# Patient Record
Sex: Female | Born: 1950 | Race: White | Hispanic: No | Marital: Single | Smoking: Current every day smoker
Health system: Southern US, Community
[De-identification: ages and names within clinical notes are randomized; demographics above are authoritative.]

## PROBLEM LIST (undated history)

## (undated) DIAGNOSIS — K2270 Barrett's esophagus without dysplasia: Secondary | ICD-10-CM

## (undated) DIAGNOSIS — M810 Age-related osteoporosis without current pathological fracture: Secondary | ICD-10-CM

## (undated) DIAGNOSIS — K859 Acute pancreatitis without necrosis or infection, unspecified: Secondary | ICD-10-CM

## (undated) DIAGNOSIS — F319 Bipolar disorder, unspecified: Secondary | ICD-10-CM

## (undated) DIAGNOSIS — G709 Myoneural disorder, unspecified: Secondary | ICD-10-CM

## (undated) DIAGNOSIS — I639 Cerebral infarction, unspecified: Secondary | ICD-10-CM

## (undated) DIAGNOSIS — K219 Gastro-esophageal reflux disease without esophagitis: Secondary | ICD-10-CM

## (undated) DIAGNOSIS — E119 Type 2 diabetes mellitus without complications: Secondary | ICD-10-CM

## (undated) DIAGNOSIS — R809 Proteinuria, unspecified: Secondary | ICD-10-CM

## (undated) DIAGNOSIS — M5136 Other intervertebral disc degeneration, lumbar region: Secondary | ICD-10-CM

## (undated) DIAGNOSIS — E7800 Pure hypercholesterolemia, unspecified: Secondary | ICD-10-CM

## (undated) DIAGNOSIS — M5030 Other cervical disc degeneration, unspecified cervical region: Secondary | ICD-10-CM

## (undated) DIAGNOSIS — M51369 Other intervertebral disc degeneration, lumbar region without mention of lumbar back pain or lower extremity pain: Secondary | ICD-10-CM

## (undated) HISTORY — PX: MOHS SURGERY: SHX181

## (undated) HISTORY — PX: TUBAL LIGATION: SHX77

## (undated) HISTORY — PX: BLADDER EXSTROPHY CLOSURE: SUR261

## (undated) HISTORY — PX: COLONOSCOPY: SHX5424

## (undated) HISTORY — PX: OTHER SURGICAL HISTORY: SHX169

---

## 2007-08-21 ENCOUNTER — Ambulatory Visit: Payer: Self-pay | Admitting: Internal Medicine

## 2009-01-12 ENCOUNTER — Ambulatory Visit: Payer: Self-pay | Admitting: Internal Medicine

## 2009-05-16 ENCOUNTER — Ambulatory Visit: Payer: Self-pay | Admitting: Internal Medicine

## 2010-02-11 ENCOUNTER — Ambulatory Visit: Payer: Self-pay | Admitting: Internal Medicine

## 2010-07-13 ENCOUNTER — Ambulatory Visit: Payer: Self-pay | Admitting: Internal Medicine

## 2010-11-07 ENCOUNTER — Emergency Department: Payer: Self-pay | Admitting: Emergency Medicine

## 2011-03-27 ENCOUNTER — Ambulatory Visit: Payer: Self-pay | Admitting: Family Medicine

## 2011-05-09 ENCOUNTER — Inpatient Hospital Stay: Payer: Self-pay | Admitting: Internal Medicine

## 2011-07-20 ENCOUNTER — Ambulatory Visit: Payer: Self-pay | Admitting: Gastroenterology

## 2011-08-05 ENCOUNTER — Ambulatory Visit: Payer: Self-pay | Admitting: Gastroenterology

## 2011-08-08 ENCOUNTER — Ambulatory Visit: Payer: Self-pay

## 2011-08-22 ENCOUNTER — Ambulatory Visit: Payer: Self-pay | Admitting: Gastroenterology

## 2011-08-22 DIAGNOSIS — Z860101 Personal history of adenomatous and serrated colon polyps: Secondary | ICD-10-CM | POA: Insufficient documentation

## 2011-08-22 DIAGNOSIS — Z86010 Personal history of colonic polyps: Secondary | ICD-10-CM | POA: Insufficient documentation

## 2011-08-22 DIAGNOSIS — K2270 Barrett's esophagus without dysplasia: Secondary | ICD-10-CM | POA: Insufficient documentation

## 2011-09-14 ENCOUNTER — Ambulatory Visit: Payer: Self-pay | Admitting: Physical Medicine and Rehabilitation

## 2011-10-03 ENCOUNTER — Ambulatory Visit: Payer: Self-pay | Admitting: Internal Medicine

## 2012-04-25 ENCOUNTER — Ambulatory Visit: Payer: Self-pay | Admitting: Family Medicine

## 2012-07-11 ENCOUNTER — Encounter: Payer: Self-pay | Admitting: Podiatry

## 2012-07-31 ENCOUNTER — Encounter: Payer: Self-pay | Admitting: Podiatry

## 2012-12-03 IMAGING — CR DG CHEST 1V PORT
1 series · 1 of 1 positions shown · non-contrast
Comparison: none

REASON FOR EXAM: stroke
COMMENTS:

[view not recorded]
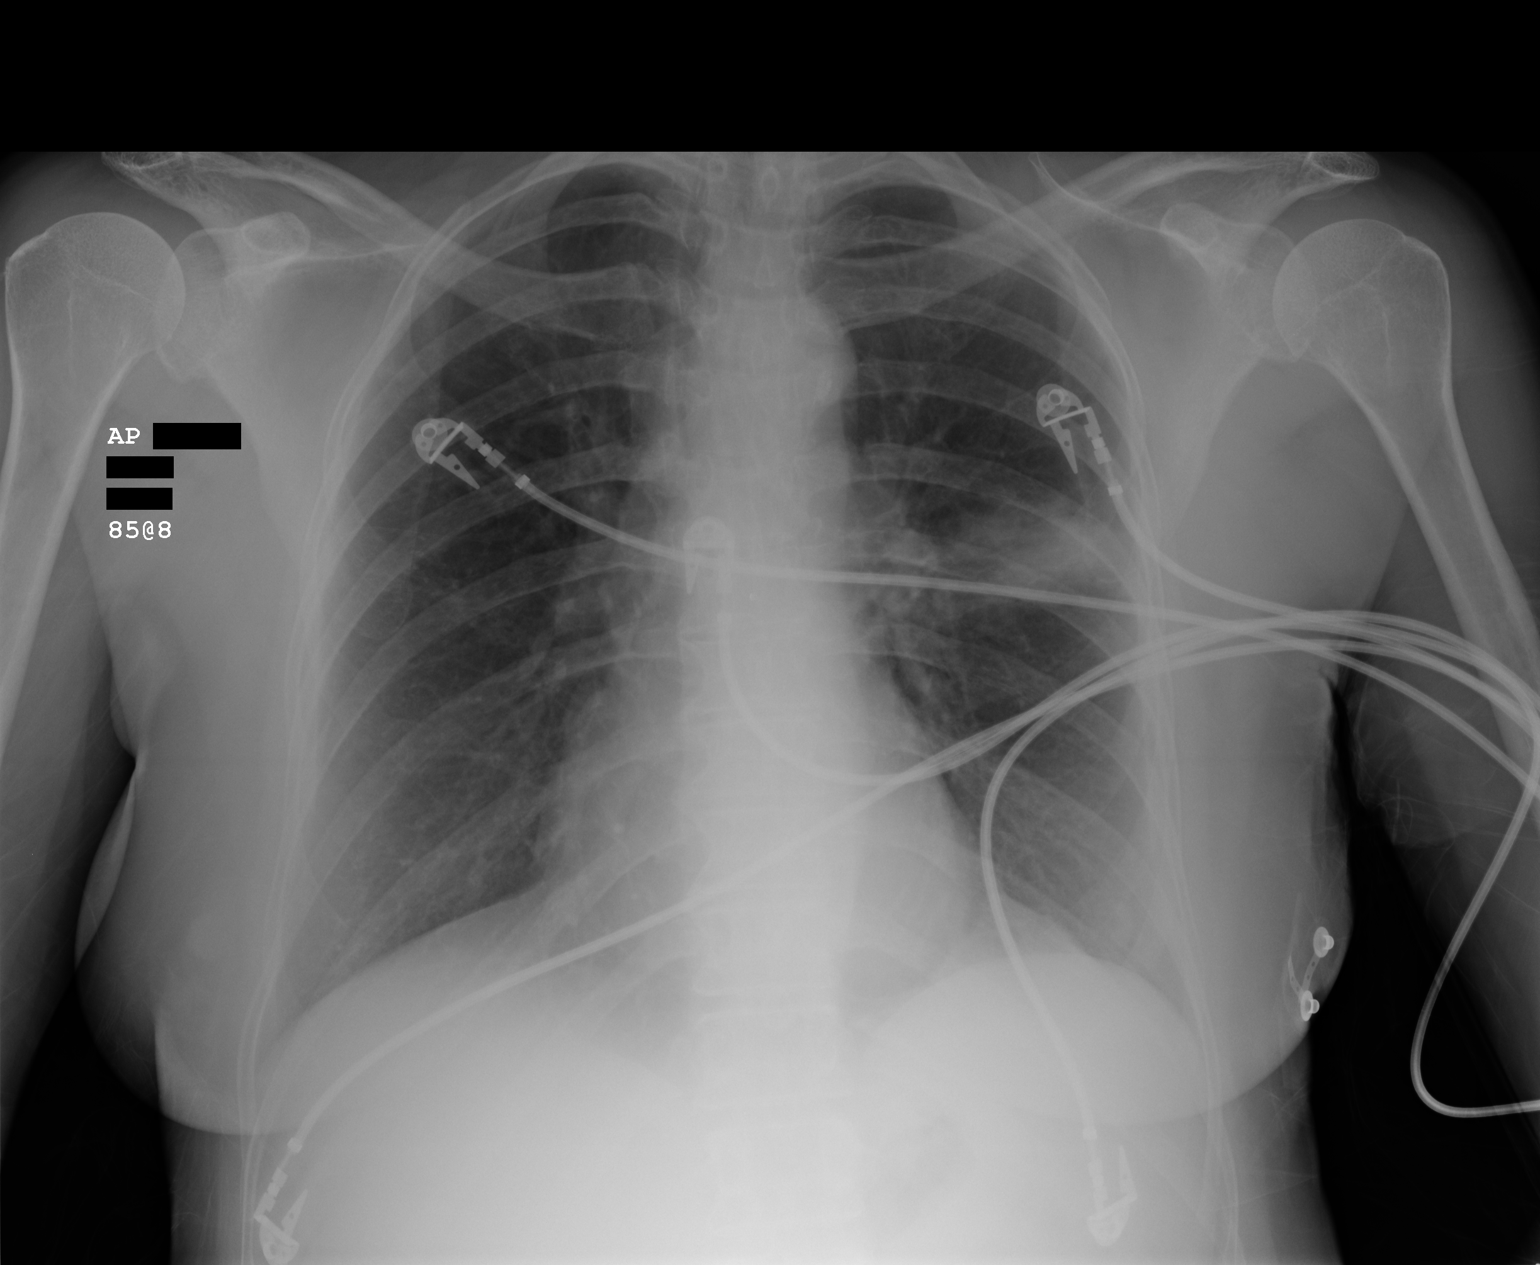

[1 of 1 positions shown; findings below may reference images not displayed]

PROCEDURE:     DXR - DXR PORTABLE CHEST SINGLE VIEW  - November 07, 2010  [DATE]

RESULT:     There is no study for comparison. There is density in the left
midlung at the level of the hilum consistent with probable left upper lobe
pneumonia. The lungs are otherwise clear. The heart and pulmonary vessels
are normal. Cardiac monitoring electrodes are present.
IMPRESSION: Left-sided pneumonia.

## 2012-12-03 IMAGING — CT CT HEAD WITHOUT CONTRAST
2 series · 16 of 30 positions shown, 20 images · non-contrast
Comparison: none

REASON FOR EXAM: stroke
COMMENTS:

[Series 2: without · axial · non-contrast · 0.39mm/px · z∈[-158,-38]mm · 13 of 28 slices shown, 17 images]
[im 2/28  brain]
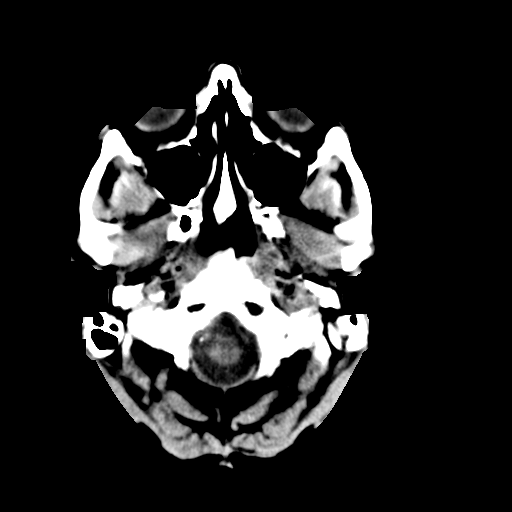
[im 2/28  bone]
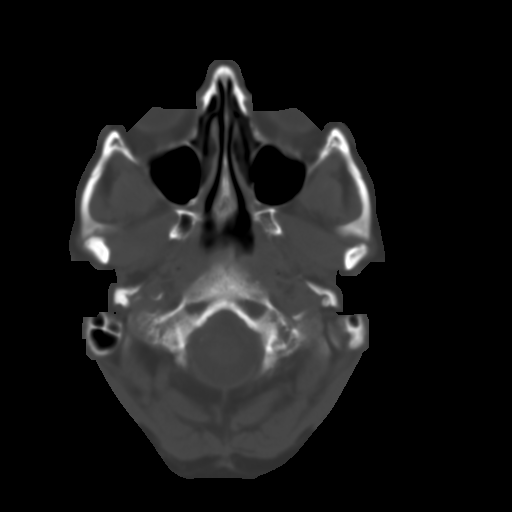
[im 4/28  brain]
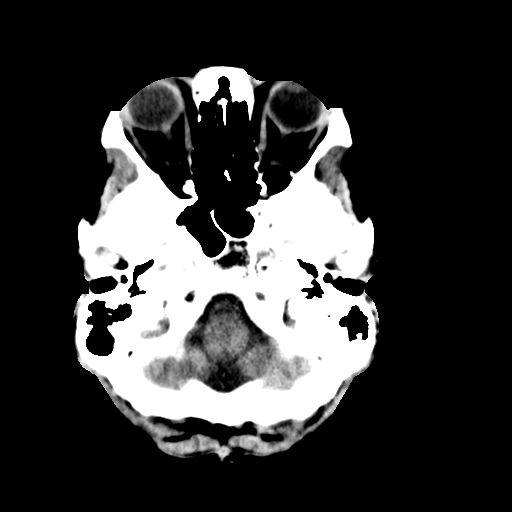
[im 6/28  brain]
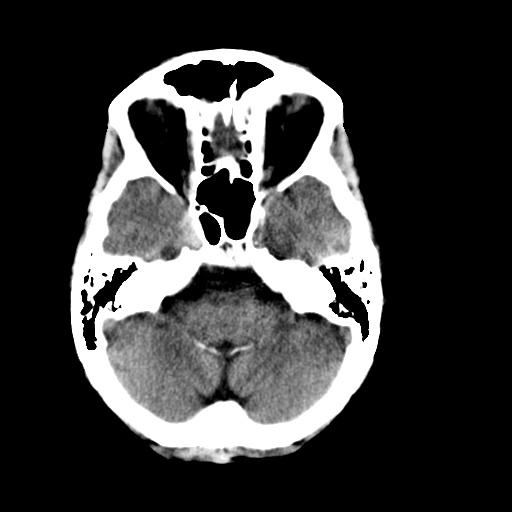
[im 8/28  brain]
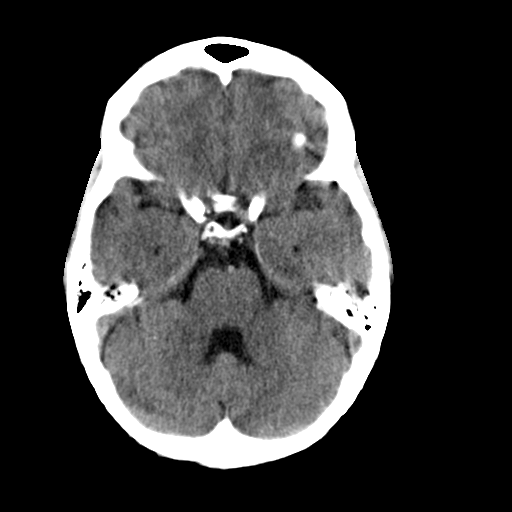
[im 10/28  brain]
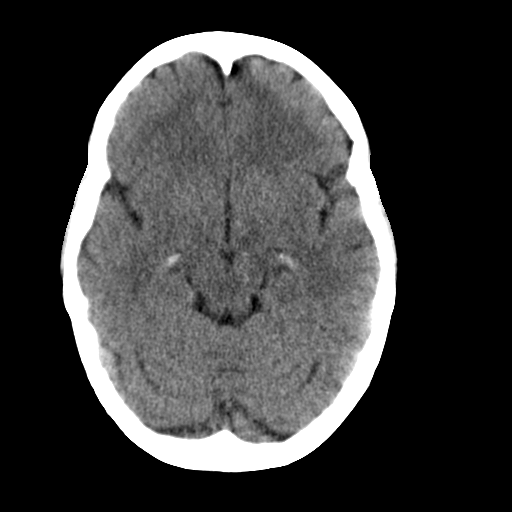
[im 10/28  bone]
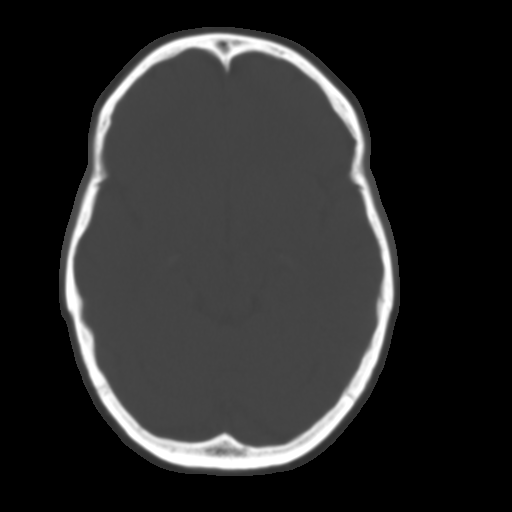
[im 12/28  brain]
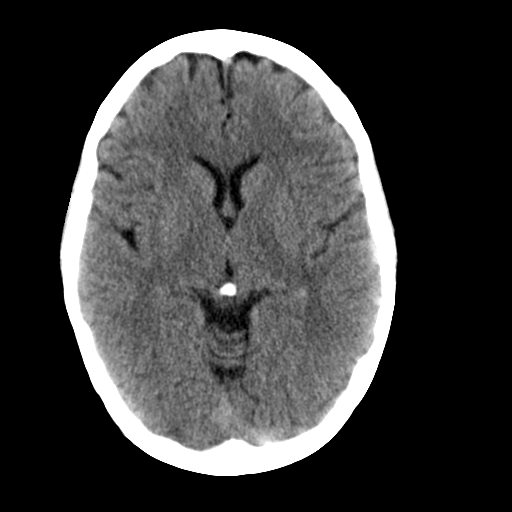
[im 14/28  brain]
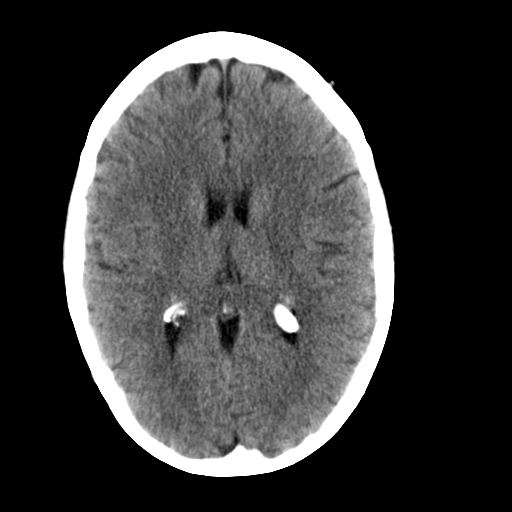
[im 16/28  brain]
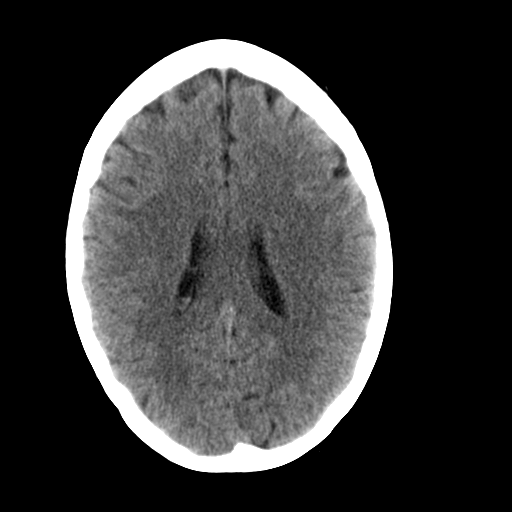
[im 18/28  brain]
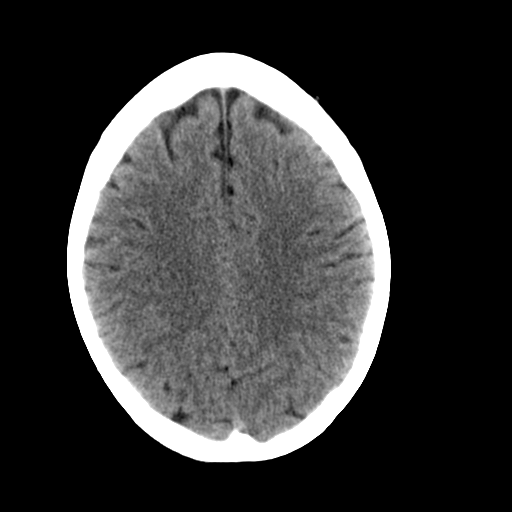
[im 18/28  bone]
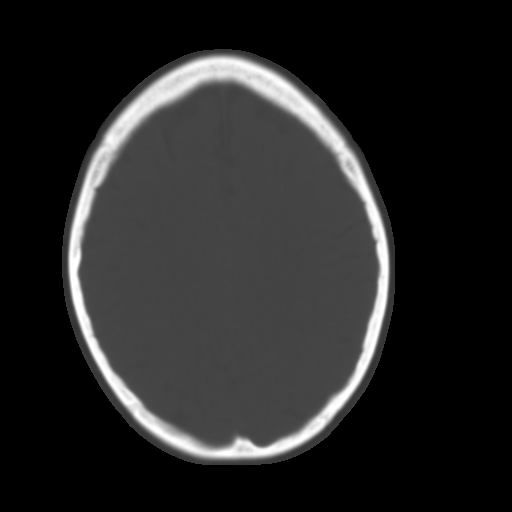
[im 20/28  brain]
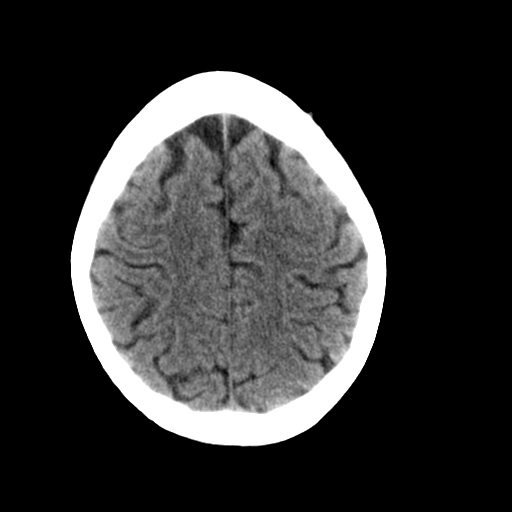
[im 22/28  brain]
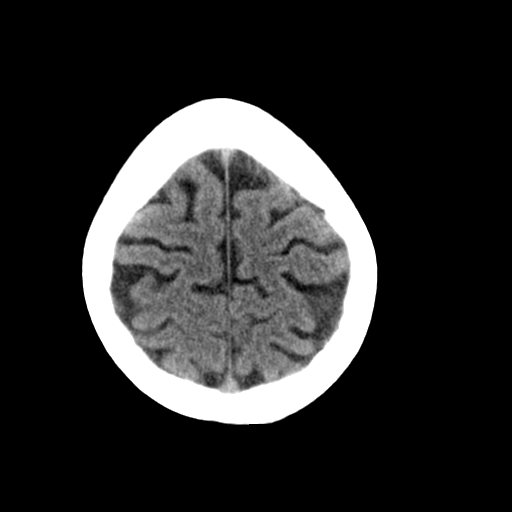
[im 24/28  brain]
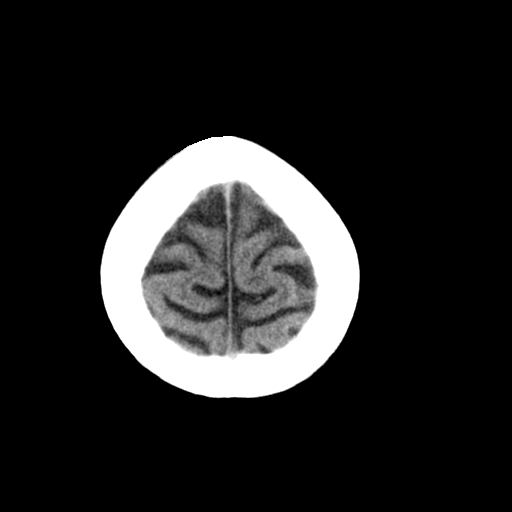
[im 26/28  brain]
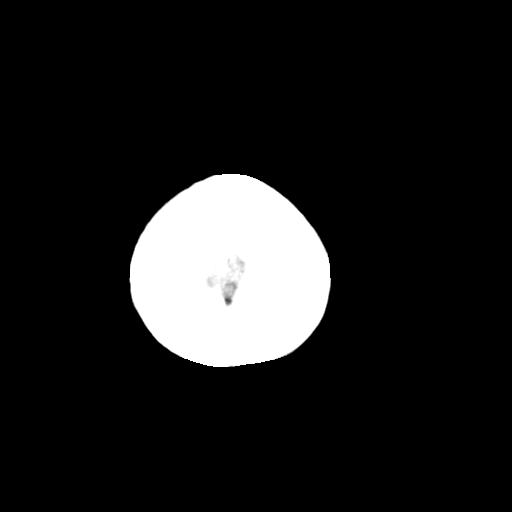
[im 26/28  bone]
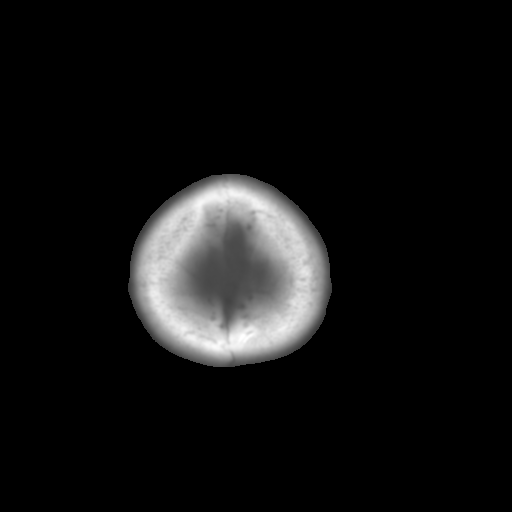

[Series 3: bone · axial · 0.39mm/px · z∈[-158,-118]mm · 3 of 28 slices shown]
[im 2/28  bone]
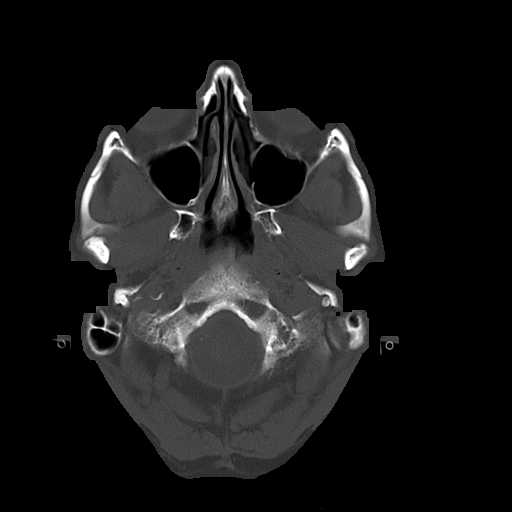
[im 6/28  bone]
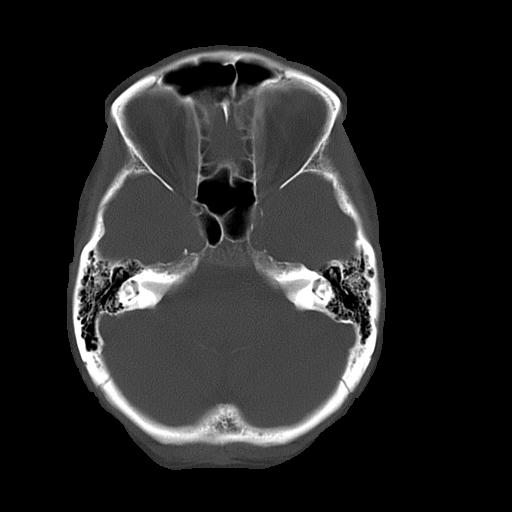
[im 10/28  bone]
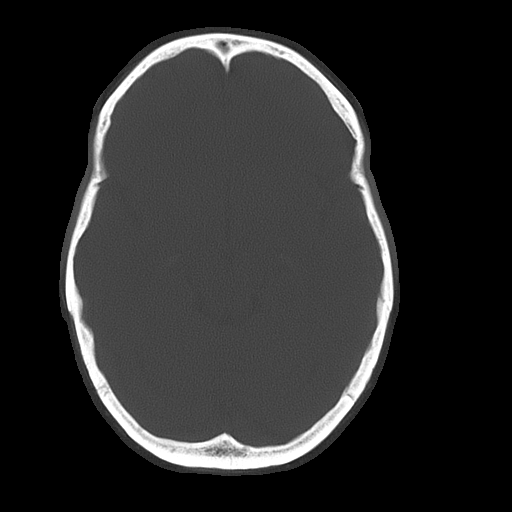

[16 of 30 positions shown; findings below may reference images not displayed]

PROCEDURE:     CT  - CT HEAD WITHOUT CONTRAST  - November 07, 2010  [DATE]

RESULT:     Noncontrast emergent CT of the brain is performed. There is no
previous exam for comparison.

The ventricles and sulci are normal. There is no hemorrhage. There is no
focal mass, mass-effect or midline shift. There is no evidence of edema or
territorial infarct. The bone windows demonstrate normal aeration of the
paranasal sinuses and mastoid air cells. There is no skull fracture
demonstrated.
IMPRESSION: 1. No acute intracranial abnormality.

## 2013-01-27 ENCOUNTER — Ambulatory Visit: Payer: Self-pay | Admitting: Emergency Medicine

## 2013-02-22 ENCOUNTER — Emergency Department: Payer: Self-pay | Admitting: Emergency Medicine

## 2013-02-26 ENCOUNTER — Ambulatory Visit: Payer: Self-pay | Admitting: Unknown Physician Specialty

## 2013-07-31 DIAGNOSIS — M549 Dorsalgia, unspecified: Secondary | ICD-10-CM | POA: Insufficient documentation

## 2013-08-26 DIAGNOSIS — M5136 Other intervertebral disc degeneration, lumbar region: Secondary | ICD-10-CM | POA: Insufficient documentation

## 2013-08-26 DIAGNOSIS — F319 Bipolar disorder, unspecified: Secondary | ICD-10-CM | POA: Insufficient documentation

## 2013-08-26 DIAGNOSIS — Z8673 Personal history of transient ischemic attack (TIA), and cerebral infarction without residual deficits: Secondary | ICD-10-CM | POA: Insufficient documentation

## 2013-09-03 IMAGING — CR DG CHEST 2V
1 series · 2 of 2 positions shown · non-contrast
Comparison: none

REASON FOR EXAM: left shoulder pain
COMMENTS:

PROCEDURE:     MDR - MDR CHEST PA(OR AP) AND LATERAL  - August 08, 2011  [DATE]
RESULT:     Comparison: 11/07/2010

[Series 1: view not recorded · 0.17mm/px · 2 of 2 slices shown]
[im 1/2]
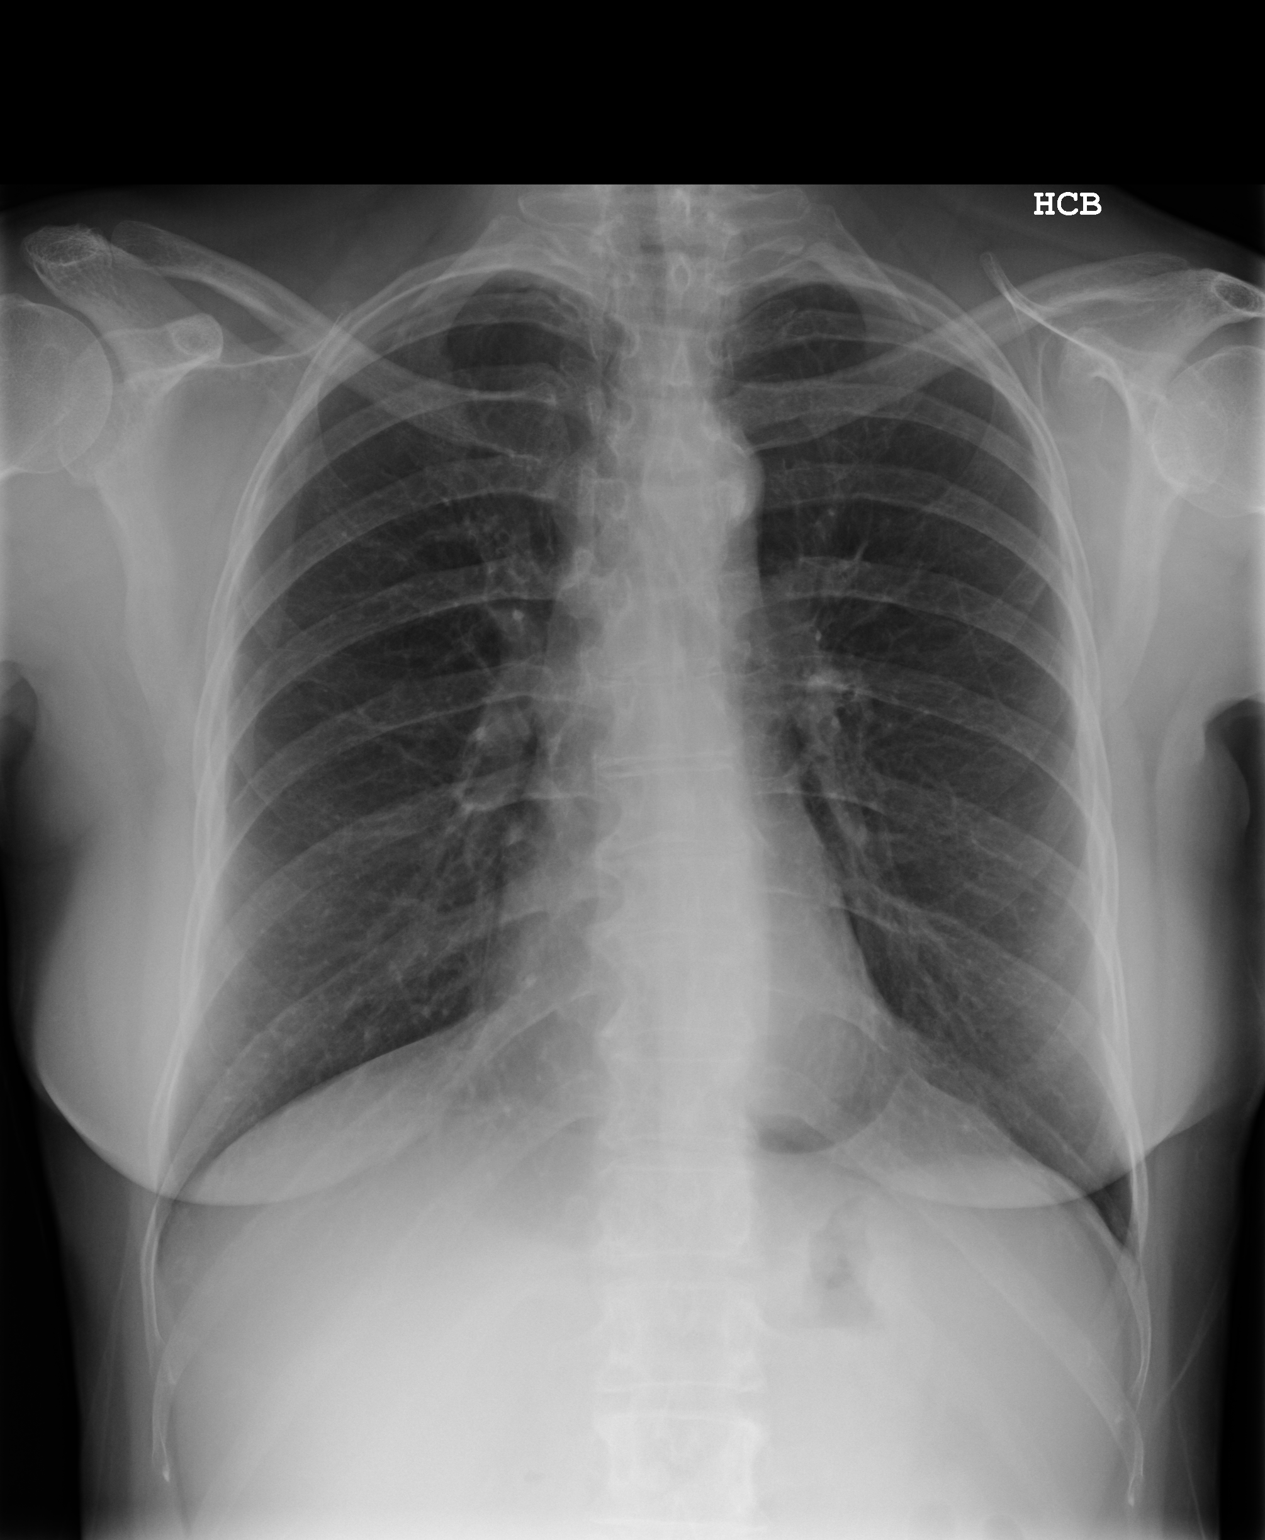
[im 2/2]
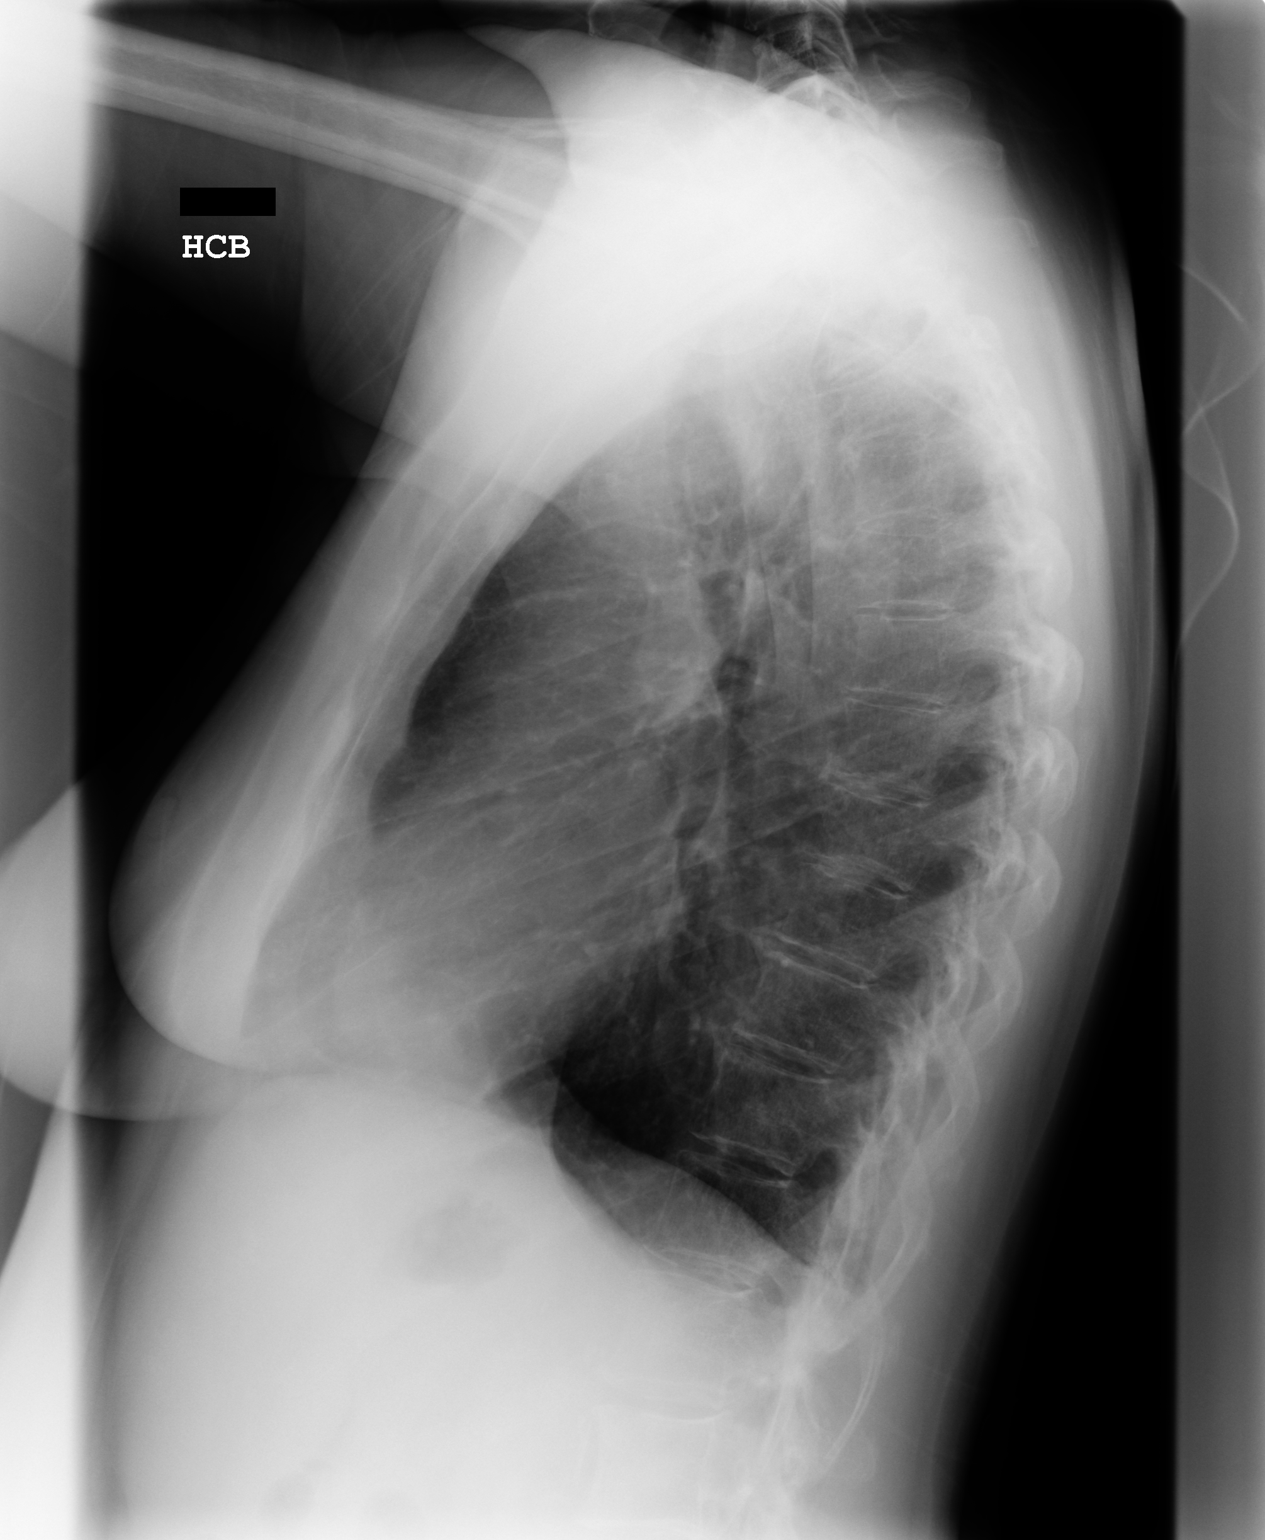

[2 of 2 positions shown; findings below may reference images not displayed]

FINDINGS: PA and lateral chest radiographs are provided.  There is no focal
parenchymal opacity, pleural effusion, or pneumothorax. The heart and
mediastinum are unremarkable.  The osseous structures are unremarkable.
IMPRESSION: No acute disease of the chest.

## 2013-12-06 DIAGNOSIS — M810 Age-related osteoporosis without current pathological fracture: Secondary | ICD-10-CM | POA: Insufficient documentation

## 2014-01-23 ENCOUNTER — Ambulatory Visit: Payer: Self-pay | Admitting: Emergency Medicine

## 2014-01-23 LAB — URINALYSIS, COMPLETE
Glucose,UR: 250 mg/dL (ref 0–75)
Ketone: NEGATIVE
Nitrite: POSITIVE
Ph: 6 (ref 4.5–8.0)
Specific Gravity: 1.025 (ref 1.003–1.030)

## 2014-01-25 LAB — URINE CULTURE

## 2014-02-21 ENCOUNTER — Ambulatory Visit: Payer: Self-pay | Admitting: Physical Medicine and Rehabilitation

## 2014-05-13 ENCOUNTER — Ambulatory Visit: Payer: Self-pay | Admitting: Family Medicine

## 2014-06-19 DIAGNOSIS — M5416 Radiculopathy, lumbar region: Secondary | ICD-10-CM | POA: Insufficient documentation

## 2014-08-02 DIAGNOSIS — G629 Polyneuropathy, unspecified: Secondary | ICD-10-CM | POA: Insufficient documentation

## 2014-08-02 DIAGNOSIS — E1165 Type 2 diabetes mellitus with hyperglycemia: Secondary | ICD-10-CM

## 2014-08-02 DIAGNOSIS — R809 Proteinuria, unspecified: Secondary | ICD-10-CM | POA: Insufficient documentation

## 2014-08-02 DIAGNOSIS — E1129 Type 2 diabetes mellitus with other diabetic kidney complication: Secondary | ICD-10-CM | POA: Insufficient documentation

## 2014-08-02 DIAGNOSIS — E1140 Type 2 diabetes mellitus with diabetic neuropathy, unspecified: Secondary | ICD-10-CM | POA: Insufficient documentation

## 2014-08-02 DIAGNOSIS — IMO0002 Reserved for concepts with insufficient information to code with codable children: Secondary | ICD-10-CM | POA: Insufficient documentation

## 2014-08-04 DIAGNOSIS — E7800 Pure hypercholesterolemia, unspecified: Secondary | ICD-10-CM | POA: Insufficient documentation

## 2014-10-02 ENCOUNTER — Ambulatory Visit: Payer: Self-pay | Admitting: Physical Medicine and Rehabilitation

## 2015-07-17 ENCOUNTER — Encounter: Payer: Self-pay | Admitting: Anesthesiology

## 2015-07-17 ENCOUNTER — Encounter
Admission: RE | Disposition: A | Discharge status: Home / Self Care | Payer: Self-pay | Source: Ambulatory Visit | Attending: Gastroenterology

## 2015-07-17 ENCOUNTER — Ambulatory Visit: Payer: Medicare Other | Admitting: Anesthesiology

## 2015-07-17 ENCOUNTER — Ambulatory Visit
Admission: RE | Admit: 2015-07-17 | Discharge: 2015-07-17 | Disposition: A | Discharge status: Home / Self Care | Payer: Medicare Other | Source: Ambulatory Visit | Attending: Gastroenterology | Admitting: Gastroenterology

## 2015-07-17 DIAGNOSIS — K644 Residual hemorrhoidal skin tags: Secondary | ICD-10-CM | POA: Diagnosis not present

## 2015-07-17 DIAGNOSIS — Z79899 Other long term (current) drug therapy: Secondary | ICD-10-CM | POA: Insufficient documentation

## 2015-07-17 DIAGNOSIS — M5090 Cervical disc disorder, unspecified, unspecified cervical region: Secondary | ICD-10-CM | POA: Insufficient documentation

## 2015-07-17 DIAGNOSIS — K2270 Barrett's esophagus without dysplasia: Secondary | ICD-10-CM | POA: Insufficient documentation

## 2015-07-17 DIAGNOSIS — Z7982 Long term (current) use of aspirin: Secondary | ICD-10-CM | POA: Diagnosis not present

## 2015-07-17 DIAGNOSIS — M810 Age-related osteoporosis without current pathological fracture: Secondary | ICD-10-CM | POA: Diagnosis not present

## 2015-07-17 DIAGNOSIS — Z8673 Personal history of transient ischemic attack (TIA), and cerebral infarction without residual deficits: Secondary | ICD-10-CM | POA: Diagnosis not present

## 2015-07-17 DIAGNOSIS — K621 Rectal polyp: Secondary | ICD-10-CM | POA: Diagnosis not present

## 2015-07-17 DIAGNOSIS — F319 Bipolar disorder, unspecified: Secondary | ICD-10-CM | POA: Insufficient documentation

## 2015-07-17 DIAGNOSIS — K2950 Unspecified chronic gastritis without bleeding: Secondary | ICD-10-CM | POA: Insufficient documentation

## 2015-07-17 DIAGNOSIS — Z86010 Personal history of colonic polyps: Secondary | ICD-10-CM | POA: Diagnosis not present

## 2015-07-17 DIAGNOSIS — D125 Benign neoplasm of sigmoid colon: Secondary | ICD-10-CM | POA: Diagnosis not present

## 2015-07-17 DIAGNOSIS — E780 Pure hypercholesterolemia: Secondary | ICD-10-CM | POA: Diagnosis not present

## 2015-07-17 DIAGNOSIS — M5136 Other intervertebral disc degeneration, lumbar region: Secondary | ICD-10-CM | POA: Diagnosis not present

## 2015-07-17 DIAGNOSIS — D123 Benign neoplasm of transverse colon: Secondary | ICD-10-CM | POA: Insufficient documentation

## 2015-07-17 DIAGNOSIS — G709 Myoneural disorder, unspecified: Secondary | ICD-10-CM | POA: Insufficient documentation

## 2015-07-17 DIAGNOSIS — Z794 Long term (current) use of insulin: Secondary | ICD-10-CM | POA: Insufficient documentation

## 2015-07-17 DIAGNOSIS — K219 Gastro-esophageal reflux disease without esophagitis: Secondary | ICD-10-CM | POA: Insufficient documentation

## 2015-07-17 DIAGNOSIS — K2980 Duodenitis without bleeding: Secondary | ICD-10-CM | POA: Diagnosis not present

## 2015-07-17 DIAGNOSIS — E119 Type 2 diabetes mellitus without complications: Secondary | ICD-10-CM | POA: Diagnosis not present

## 2015-07-17 HISTORY — DX: Other intervertebral disc degeneration, lumbar region: M51.36

## 2015-07-17 HISTORY — DX: Type 2 diabetes mellitus without complications: E11.9

## 2015-07-17 HISTORY — DX: Other intervertebral disc degeneration, lumbar region without mention of lumbar back pain or lower extremity pain: M51.369

## 2015-07-17 HISTORY — DX: Other cervical disc degeneration, unspecified cervical region: M50.30

## 2015-07-17 HISTORY — DX: Gastro-esophageal reflux disease without esophagitis: K21.9

## 2015-07-17 HISTORY — DX: Bipolar disorder, unspecified: F31.9

## 2015-07-17 HISTORY — DX: Proteinuria, unspecified: R80.9

## 2015-07-17 HISTORY — DX: Age-related osteoporosis without current pathological fracture: M81.0

## 2015-07-17 HISTORY — PX: ESOPHAGOGASTRODUODENOSCOPY (EGD) WITH PROPOFOL: SHX5813

## 2015-07-17 HISTORY — DX: Pure hypercholesterolemia, unspecified: E78.00

## 2015-07-17 HISTORY — DX: Myoneural disorder, unspecified: G70.9

## 2015-07-17 HISTORY — DX: Barrett's esophagus without dysplasia: K22.70

## 2015-07-17 HISTORY — PX: COLONOSCOPY WITH PROPOFOL: SHX5780

## 2015-07-17 HISTORY — DX: Cerebral infarction, unspecified: I63.9

## 2015-07-17 HISTORY — DX: Acute pancreatitis without necrosis or infection, unspecified: K85.90

## 2015-07-17 LAB — GLUCOSE, CAPILLARY: Glucose-Capillary: 196 mg/dL — ABNORMAL HIGH (ref 65–99)

## 2015-07-17 SURGERY — COLONOSCOPY WITH PROPOFOL
Anesthesia: General

## 2015-07-17 MED ORDER — PHENYLEPHRINE HCL 10 MG/ML IJ SOLN
INTRAMUSCULAR | Status: DC | PRN
  Administered 2015-07-17 (×3): 200 ug via INTRAVENOUS
  Administered 2015-07-17: 100 ug via INTRAVENOUS
  Administered 2015-07-17: 200 ug via INTRAVENOUS
  Administered 2015-07-17 (×2): 100 ug via INTRAVENOUS
  Administered 2015-07-17: 200 ug via INTRAVENOUS
  Administered 2015-07-17: 100 ug via INTRAVENOUS

## 2015-07-17 MED ORDER — IPRATROPIUM-ALBUTEROL 0.5-2.5 (3) MG/3ML IN SOLN
RESPIRATORY_TRACT | Status: AC
  Filled 2015-07-17: qty 3

## 2015-07-17 MED ORDER — LIDOCAINE HCL (CARDIAC) 20 MG/ML IV SOLN
INTRAVENOUS | Status: DC | PRN
  Administered 2015-07-17: 100 mg via INTRAVENOUS

## 2015-07-17 MED ORDER — SODIUM CHLORIDE 0.9 % IV SOLN: INTRAVENOUS | Status: DC

## 2015-07-17 MED ORDER — PROPOFOL 10 MG/ML IV BOLUS
INTRAVENOUS | Status: DC | PRN
  Administered 2015-07-17 (×3): 40 mg via INTRAVENOUS
  Administered 2015-07-17: 20 mg via INTRAVENOUS
  Administered 2015-07-17 (×2): 40 mg via INTRAVENOUS
  Administered 2015-07-17: 50 mg via INTRAVENOUS
  Administered 2015-07-17 (×2): 40 mg via INTRAVENOUS

## 2015-07-17 MED ORDER — SODIUM CHLORIDE 0.9 % IV SOLN
INTRAVENOUS | Status: DC
  Administered 2015-07-17: 1000 mL via INTRAVENOUS

## 2015-07-17 MED ORDER — GLYCOPYRROLATE 0.2 MG/ML IJ SOLN
INTRAMUSCULAR | Status: DC | PRN
  Administered 2015-07-17 (×3): 0.2 mg via INTRAVENOUS

## 2015-07-17 MED ORDER — IPRATROPIUM-ALBUTEROL 0.5-2.5 (3) MG/3ML IN SOLN
3.0000 mL | Freq: Once | RESPIRATORY_TRACT | Status: DC | PRN
  Administered 2015-07-17 (×2): 3 mL via RESPIRATORY_TRACT

## 2015-07-17 MED ORDER — PROPOFOL INFUSION 10 MG/ML OPTIME
INTRAVENOUS | Status: DC | PRN
  Administered 2015-07-17: 150 ug/kg/min via INTRAVENOUS

## 2015-07-17 MED ORDER — IPRATROPIUM-ALBUTEROL 0.5-2.5 (3) MG/3ML IN SOLN: 3.0000 mL | Freq: Once | RESPIRATORY_TRACT | Status: DC | PRN

## 2015-07-17 NOTE — Transfer of Care (Signed)
Immediate Anesthesia Transfer of Care Note  Patient: Kimberly Richardson  Procedure(s) Performed: Procedure(s): COLONOSCOPY WITH PROPOFOL (N/A) ESOPHAGOGASTRODUODENOSCOPY (EGD) WITH PROPOFOL (N/A)  Patient Location: Endoscopy Unit  Anesthesia Type:General  Level of Consciousness: sedated  Airway & Oxygen Therapy: Patient Spontanous Breathing and Patient connected to nasal cannula oxygen  Post-op Assessment: Report given to RN and Post -op Vital signs reviewed and stable  Post vital signs: Reviewed and stable  Last Vitals:  Filed Vitals:   07/17/15 0808  BP: 127/56  Pulse: 84  Temp: 35.8 C  Resp: 18    Complications: No apparent anesthesia complications

## 2015-07-17 NOTE — Op Note (Signed)
[facility] Patient Name: Kimberly Richardson Procedure Date: 07/17/2015 8:59 AM MRN: [id number] Account #: [id number] Date of Birth: May 21, 1951 Admit Type: Outpatient Age: 64 Room: [facility] ENDO ROOM 2 Gender: Female Note Status: Finalized Procedure:         Colonoscopy Indications:       Personal history of colonic polyps Providers:         Lollie Sails, MD Referring MD:      Christena Flake. Raechel Ache, MD (Referring MD) Medicines:         Monitored Anesthesia Care Complications:     No immediate complications. Procedure:         Pre-Anesthesia Assessment:                    - ASA Grade Assessment: III - A patient with severe                     systemic disease.                    After obtaining informed consent, the colonoscope was                     passed under direct vision. Throughout the procedure, the                     patient's blood pressure, pulse, and oxygen saturations                     were monitored continuously. The Colonoscope was                     introduced through the anus and advanced to the the cecum,                     identified by appendiceal orifice and ileocecal valve. The                     colonoscopy was performed with moderate difficulty. The                     quality of the bowel preparation was good. The patient                     tolerated the procedure well. Findings:      A 4 mm polyp was found in the proximal transverse colon. The polyp was       flat. The polyp was removed with a cold snare. Resection and retrieval       were complete.      A 10 mm polyp was found in the transverse colon. The polyp was flat. The       polyp was removed with a hot snare. Resection and retrieval were       complete.      Three sessile polyps were found in the distal sigmoid colon. The polyps       were 2 to 3 mm in size. These polyps were removed with a cold biopsy       forceps. Resection and retrieval were complete.      Two  sessile polyps were found in the rectum. The polyps were 1 to 2 mm       in size. These polyps were removed with a cold biopsy forceps. Resection       and retrieval  were complete.      Four sessile polyps were found in the rectum. The polyps were 3 to 4 mm       in size. These polyps were removed with a cold snare. Resection and       retrieval were complete.      The digital rectal exam was normal.      The perianal exam findings include skin tags.      Multiple small-mouthed diverticula were found in the sigmoid colon and       in the descending colon. Impression:        - One 4 mm polyp in the proximal transverse colon.                     Resected and retrieved.                    - One 10 mm polyp in the transverse colon. Resected and                     retrieved.                    - Three 2 to 3 mm polyps in the distal sigmoid colon.                     Resected and retrieved.                    - Two 1 to 2 mm polyps in the rectum. Resected and                     retrieved.                    - Four 3 to 4 mm polyps in the rectum. Resected and                     retrieved.                    - Perianal skin tags found on perianal exam. Recommendation:    - Await pathology results.                    - Telephone GI clinic for pathology results in 1 week. Procedure Code(s): --- Professional ---                    [phone], Colonoscopy, flexible; with removal of tumor(s),                     polyp(s), or other lesion(s) by snare technique                    45380, 59, Colonoscopy, flexible; with biopsy, single or                     multiple Diagnosis Code(s): --- Professional ---                    211.3, Benign neoplasm of colon                    569.0, Anal and rectal polyp                    455.9, Residual hemorrhoidal skin tags  V12.72, Personal history of colonic polyps CPT copyright 2014 American Medical Association. All rights reserved. The codes  documented in this report are preliminary and upon coder review may  be revised to meet current compliance requirements. Lollie Sails, MD 07/17/2015 10:20:07 AM This report has been signed electronically. Number of Addenda: 0 Note Initiated On: 07/17/2015 8:59 AM Scope Withdrawal Time: 0 hours 25 minutes 58 seconds  Total Procedure Duration: 0 hours 37 minutes 0 seconds       [facility]

## 2015-07-17 NOTE — Anesthesia Preprocedure Evaluation (Signed)
Anesthesia Evaluation  Patient identified by MRN, date of birth, ID band Patient awake    Reviewed: Allergy & Precautions, H&P , NPO status , Patient's Chart, lab work & pertinent test results  History of Anesthesia Complications Negative for: history of anesthetic complications  Airway Mallampati: III  TM Distance: >3 FB Neck ROM: limited    Dental  (+) Poor Dentition, Missing, Upper Dentures, Lower Dentures   Pulmonary Current Smoker,    Pulmonary exam normal breath sounds clear to auscultation       Cardiovascular Exercise Tolerance: Good (-) Past MI Normal cardiovascular exam Rhythm:regular Rate:Normal     Neuro/Psych PSYCHIATRIC DISORDERS Bipolar Disorder  Neuromuscular disease CVA (left sided weakness), Residual Symptoms negative psych ROS   GI/Hepatic Neg liver ROS, GERD  ,  Endo/Other  diabetes, Poorly Controlled, Type 2, Insulin Dependent  Renal/GU negative Renal ROS  negative genitourinary   Musculoskeletal  (+) Arthritis ,   Abdominal   Peds  Hematology negative hematology ROS (+)   Anesthesia Other Findings Past Medical History:   Diabetes mellitus without complication                       Bipolar disorder                                             Hypercholesteremia                                           Stroke                                                       Degenerative disc disease, lumbar                            Pancreatitis, acute                                          DDD (degenerative disc disease), cervical                    Neuromuscular disorder                                       GERD (gastroesophageal reflux disease)                       Osteoporosis                                                 Proteinuria  Barrett esophagus                                            Patient reports negative cardiac stress test this  year  Reproductive/Obstetrics negative OB ROS                             Anesthesia Physical Anesthesia Plan  ASA: IV  Anesthesia Plan: General   Post-op Pain Management:    Induction:   Airway Management Planned:   Additional Equipment:   Intra-op Plan:   Post-operative Plan:   Informed Consent: I have reviewed the patients History and Physical, chart, labs and discussed the procedure including the risks, benefits and alternatives for the proposed anesthesia with the patient or authorized representative who has indicated his/her understanding and acceptance.   Dental Advisory Given  Plan Discussed with: Anesthesiologist, CRNA and Surgeon  Anesthesia Plan Comments: (Patient informed that they are higher risk for complications from anesthesia during this procedure due to their medical history.  Patient voiced understanding. )        Anesthesia Quick Evaluation

## 2015-07-17 NOTE — Op Note (Signed)
[facility]enter[facility] Patient Name: Kimberly Richardson Procedure Date: 07/17/2015 9:03 AM MRN: [id number] Account #: [id number] Date of Birth: 15-Aug-1951 Admit Type: Inpatient Age: 64 Room: [facility] ENDO ROOM 2 Gender: Female Note Status: Finalized Procedure:         Upper GI endoscopy Indications:       Follow-up of Barrett's esophagus Providers:         Lollie Sails, MD Referring MD:      Christena Flake. Raechel Ache, MD (Referring MD) Medicines:         Monitored Anesthesia Care Complications:     No immediate complications. Procedure:         Pre-Anesthesia Assessment:                    - ASA Grade Assessment: III - A patient with severe                     systemic disease.                    After obtaining informed consent, the endoscope was passed                     under direct vision. Throughout the procedure, the                     patient's blood pressure, pulse, and oxygen saturations                     were monitored continuously. The Olympus GIF-160 endoscope                     (S#. [id number]) was introduced through the mouth, and                     advanced to the third part of duodenum. The upper GI                     endoscopy was accomplished without difficulty. The patient                     tolerated the procedure well. Findings:      The Z-line was variable.      There were esophageal mucosal changes secondary to established       short-segment Barrett's disease present at the gastroesophageal       junction. The maximum longitudinal extent of these mucosal changes was 1       cm in length. Mucosa was biopsied with a cold forceps for histology in 4       quadrants.      The exam of the esophagus was otherwise normal.      Patchy minimal inflammation characterized by erythema was found in the       gastric antrum. Biopsies were taken with a cold forceps for histology.      Patchy mucosal flattening was found in the duodenal bulb, at 2nd part of      the duodenum and at 3rd part of the duodenum. Biopsies were taken with a       cold forceps for histology.      The cardia and gastric fundus were normal on retroflexion. Impression:        - Z-line variable.                    -  Esophageal mucosal changes secondary to established                     short-segment Barrett's disease. Biopsied.                    - Gastritis. Biopsied.                    - Flattened mucosa was found in the duodenum, not                     consistent with celiac disease. Biopsied. Recommendation:    - Continue present medications.                    - Telephone GI clinic for pathology results in 1 week. Procedure Code(s): --- Professional ---                    [id number], Esophagogastroduodenoscopy, flexible, transoral;                     with biopsy, single or multiple Diagnosis Code(s): --- Professional ---                    530.89, Other specified disorders of esophagus                    530.85, Barrett's esophagus                    535.50, Unspecified gastritis and gastroduodenitis,                     without mention of hemorrhage                    537.9, Unspecified disorder of stomach and duodenum CPT copyright 2014 American Medical Association. All rights reserved. The codes documented in this report are preliminary and upon coder review may  be revised to meet current compliance requirements. Lollie Sails, MD 07/17/2015 9:33:01 AM This report has been signed electronically. Number of Addenda: 0 Note Initiated On: 07/17/2015 9:03 AM      [facility]

## 2015-07-17 NOTE — Progress Notes (Signed)
Patient from procedure room, very sedatated and coughing almost in a heaving manner.  Anesthesia at bedside, 2 Duo neb treatments administered.  Patient slowly began to awaken, states she is a smoker and coughs A lot in the morning.  Nephew is at bedside now.  Pt. Taking small sips of some ginger ale.  Nephew states  patient And himself have also had a cough and bronchitis type symptoms.  Patient anxious at times.  Dr. Gustavo Lah And anesthesia aware. Continued to reassure patient and family.  Coughing now greatly decreasing.

## 2015-07-17 NOTE — Anesthesia Postprocedure Evaluation (Signed)
  Anesthesia Post-op Note  Patient: Kimberly Richardson  Procedure(s) Performed: Procedure(s): COLONOSCOPY WITH PROPOFOL (N/A) ESOPHAGOGASTRODUODENOSCOPY (EGD) WITH PROPOFOL (N/A)  Anesthesia type:General  Patient location: PACU  Post pain: Pain level controlled  Post assessment: Post-op Vital signs reviewed, Patient's Cardiovascular Status Stable, Respiratory Function Stable, Patent Airway and No signs of Nausea or vomiting  Post vital signs: Reviewed and stable  Last Vitals:  Filed Vitals:   07/17/15 1125  BP: 120/68  Pulse: 77  Temp:   Resp: 14    Level of consciousness: awake, alert  and patient cooperative  Complications: No apparent anesthesia complications.  Smokers cough that resolved with Duoneb.

## 2015-07-17 NOTE — H&P (Signed)
Outpatient short stay form Pre-procedure 07/17/2015 9:06 AM Lollie Sails MD  Primary Physician: Dr. Genene Churn  Reason for visit:  EGD and colonoscopy  History of present illness:  Patient is a 64 year old female with a personal history of Barrett's esophagus as well as adenomatous colon polyps. He tolerated her prep well. She interrupted her aspirin for about a week now. He takes no other aspirin products or blood thinners.    Patient evaluated [facility] cardiologist about 3 months ago with no abnormalities. She had some symptomatic chest heaviness that has subsequently seem to respond to use of Zantac and Nexium.    Current facility-administered medications:  .  0.9 %  sodium chloride infusion, , Intravenous, Continuous, Lollie Sails, MD, Last Rate: 20 mL/hr at 07/17/15 0821, 1,000 mL at 07/17/15 0821 .  0.9 %  sodium chloride infusion, , Intravenous, Continuous, Lollie Sails, MD  Facility-Administered Medications Ordered in Other Encounters:  .  glycopyrrolate (ROBINUL) injection, , , Anesthesia Intra-op, Andria Frames, MD, 0.2 mg at 07/17/15 5852  Prescriptions prior to admission  Medication Sig Dispense Refill Last Dose  . aspirin EC 81 MG tablet Take 81 mg by mouth daily.   Past Week at Unknown time  . clonazePAM (KLONOPIN) 1 MG tablet Take 1 mg by mouth 2 (two) times daily.   07/16/2015 at 2000  . esomeprazole (NEXIUM) 40 MG capsule Take 40 mg by mouth daily at 12 noon.   Past Week at Unknown time  . glimepiride (AMARYL) 2 MG tablet Take 2 mg by mouth 2 (two) times daily.   Past Week at Unknown time  . insulin glargine (LANTUS) 100 UNIT/ML injection Inject 16 Units into the skin at bedtime.   Past Week at Unknown time  . losartan (COZAAR) 50 MG tablet Take 50 mg by mouth daily.   Past Week at Unknown time  . metFORMIN (GLUCOPHAGE) 500 MG tablet Take 1,000 mg by mouth 2 (two) times daily with a meal.   Past Week at Unknown time  . nitroGLYCERIN (NITROSTAT) 0.4 MG  SL tablet Place 0.4 mg under the tongue every 5 (five) minutes as needed for chest pain.   Past Week at Unknown time  . ranitidine (ZANTAC) 150 MG capsule Take 150 mg by mouth 2 (two) times daily.   Past Week at Unknown time  . senna (SENOKOT) 8.6 MG tablet Take 1 tablet by mouth daily.   Past Week at Unknown time  . simvastatin (ZOCOR) 40 MG tablet Take 40 mg by mouth daily.   07/16/2015 at Unknown time  . traZODone (DESYREL) 50 MG tablet Take 50 mg by mouth at bedtime.   07/16/2015 at 2000     No Known Allergies   Past Medical History  Diagnosis Date  . Diabetes mellitus without complication   . Bipolar disorder   . Hypercholesteremia   . Stroke   . Degenerative disc disease, lumbar   . Pancreatitis, acute   . DDD (degenerative disc disease), cervical   . Neuromuscular disorder   . GERD (gastroesophageal reflux disease)   . Osteoporosis   . Proteinuria   . Barrett esophagus     Review of systems:      Physical Exam    Heart and lungs: Regular rate and rhythm without rub or gallop, lungs are bilaterally clear    HEENT: Normocephalic atraumatic eyes are anicteric    Other:     Pertinant exam for procedure: Soft nontender nondistended bowel sounds positive normoactive  Planned proceedures: EGD, colonoscopy and indicated procedures. I have discussed the risks benefits and complications of procedures to include not limited to bleeding, infection, perforation and the risk of sedation and the patient wishes to proceed.    Lollie Sails, MD Gastroenterology 07/17/2015  9:06 AM

## 2015-07-18 ENCOUNTER — Encounter: Payer: Self-pay | Admitting: Gastroenterology

## 2015-07-20 LAB — SURGICAL PATHOLOGY

## 2015-08-06 DIAGNOSIS — E559 Vitamin D deficiency, unspecified: Secondary | ICD-10-CM | POA: Insufficient documentation

## 2015-11-12 ENCOUNTER — Ambulatory Visit
Admission: EM | Admit: 2015-11-12 | Discharge: 2015-11-12 | Disposition: A | Discharge status: Home / Self Care | Payer: Medicare Other | Attending: Family Medicine | Admitting: Family Medicine

## 2015-11-12 ENCOUNTER — Encounter: Payer: Self-pay | Admitting: *Deleted

## 2015-11-12 ENCOUNTER — Ambulatory Visit: Payer: Medicare Other

## 2015-11-12 DIAGNOSIS — Z7984 Long term (current) use of oral hypoglycemic drugs: Secondary | ICD-10-CM | POA: Insufficient documentation

## 2015-11-12 DIAGNOSIS — E7800 Pure hypercholesterolemia, unspecified: Secondary | ICD-10-CM | POA: Insufficient documentation

## 2015-11-12 DIAGNOSIS — N390 Urinary tract infection, site not specified: Secondary | ICD-10-CM | POA: Insufficient documentation

## 2015-11-12 DIAGNOSIS — Z7982 Long term (current) use of aspirin: Secondary | ICD-10-CM | POA: Insufficient documentation

## 2015-11-12 DIAGNOSIS — F319 Bipolar disorder, unspecified: Secondary | ICD-10-CM | POA: Insufficient documentation

## 2015-11-12 DIAGNOSIS — E119 Type 2 diabetes mellitus without complications: Secondary | ICD-10-CM | POA: Insufficient documentation

## 2015-11-12 DIAGNOSIS — Z794 Long term (current) use of insulin: Secondary | ICD-10-CM | POA: Insufficient documentation

## 2015-11-12 DIAGNOSIS — F17210 Nicotine dependence, cigarettes, uncomplicated: Secondary | ICD-10-CM | POA: Insufficient documentation

## 2015-11-12 DIAGNOSIS — Z8673 Personal history of transient ischemic attack (TIA), and cerebral infarction without residual deficits: Secondary | ICD-10-CM | POA: Diagnosis not present

## 2015-11-12 DIAGNOSIS — W19XXXA Unspecified fall, initial encounter: Secondary | ICD-10-CM | POA: Diagnosis not present

## 2015-11-12 DIAGNOSIS — M47898 Other spondylosis, sacral and sacrococcygeal region: Secondary | ICD-10-CM | POA: Diagnosis not present

## 2015-11-12 DIAGNOSIS — S300XXA Contusion of lower back and pelvis, initial encounter: Secondary | ICD-10-CM | POA: Insufficient documentation

## 2015-11-12 DIAGNOSIS — K219 Gastro-esophageal reflux disease without esophagitis: Secondary | ICD-10-CM | POA: Insufficient documentation

## 2015-11-12 DIAGNOSIS — M47817 Spondylosis without myelopathy or radiculopathy, lumbosacral region: Secondary | ICD-10-CM

## 2015-11-12 DIAGNOSIS — M533 Sacrococcygeal disorders, not elsewhere classified: Secondary | ICD-10-CM | POA: Diagnosis present

## 2015-11-12 LAB — URINALYSIS COMPLETE WITH MICROSCOPIC (ARMC ONLY)
GLUCOSE, UA: NEGATIVE mg/dL
Ketones, ur: NEGATIVE mg/dL
NITRITE: NEGATIVE
PH: 5.5 (ref 5.0–8.0)
Protein, ur: 30 mg/dL — AB
SPECIFIC GRAVITY, URINE: 1.025 (ref 1.005–1.030)

## 2015-11-12 MED ORDER — ONDANSETRON 8 MG PO TBDP
8.0000 mg | ORAL_TABLET | Freq: Once | ORAL | Status: AC
  Administered 2015-11-12: 8 mg via ORAL

## 2015-11-12 MED ORDER — TIZANIDINE HCL 4 MG PO TABS: 4.0000 mg | ORAL_TABLET | Freq: Four times a day (QID) | ORAL | Status: AC | PRN

## 2015-11-12 MED ORDER — MELOXICAM 15 MG PO TABS: 15.0000 mg | ORAL_TABLET | Freq: Every day | ORAL | Status: AC

## 2015-11-12 MED ORDER — PHENAZOPYRIDINE HCL 200 MG PO TABS: 200.0000 mg | ORAL_TABLET | Freq: Three times a day (TID) | ORAL | Status: DC | PRN

## 2015-11-12 MED ORDER — KETOROLAC TROMETHAMINE 60 MG/2ML IM SOLN
60.0000 mg | Freq: Once | INTRAMUSCULAR | Status: AC
  Administered 2015-11-12: 60 mg via INTRAMUSCULAR

## 2015-11-12 MED ORDER — CIPROFLOXACIN HCL 500 MG PO TABS: 500.0000 mg | ORAL_TABLET | Freq: Two times a day (BID) | ORAL | Status: DC

## 2015-11-12 MED ORDER — TRAMADOL HCL 50 MG PO TABS: 50.0000 mg | ORAL_TABLET | Freq: Four times a day (QID) | ORAL | Status: AC | PRN

## 2015-11-12 NOTE — ED Provider Notes (Signed)
CSN: [id number]     Arrival date & time 11/12/15  0946 History   First MD Initiated Contact with Patient 11/12/15 1329    Nurses notes were reviewed. Chief Complaint  Patient presents with  . Recurrent UTI  . Tailbone Pain   #1 She reports burning urination and frequency. She states that the burning urination started about 3 days ago. Long brain urination she's had frequency , and foul odder of her urine.  #2 patient reports falling last Thursday before the snowstorm. States she was unable to get out after the fall because the inclement weather. She reports continuing back pain and sacral pain.   (Consider location/radiation/quality/duration/timing/severity/associated sxs/prior Treatment) Patient is a 65 y.o. female presenting with frequency, dysuria, and back pain. The history is provided by the patient. No language interpreter was used.  Urinary Frequency This is a new problem. The current episode started more than 2 days ago. The problem occurs constantly. The problem has been gradually worsening. Pertinent negatives include no chest pain, no abdominal pain, no headaches and no shortness of breath. Nothing relieves the symptoms. She has tried nothing for the symptoms.  Dysuria Pain quality:  Sharp Pain severity:  Moderate Duration:  3 days Progression:  Worsening Chronicity:  New Recent urinary tract infections: no   Relieved by:  Nothing Urinary symptoms: discolored urine and frequent urination   Associated symptoms: nausea   Associated symptoms: no abdominal pain   Risk factors: no hx of pyelonephritis and no recurrent urinary tract infections   Back Pain Location:  Sacro-iliac joint and lumbar spine Quality:  Stabbing Radiates to:  Does not radiate Pain severity:  Moderate Duration:  1 week Timing:  Unable to specify Progression:  Worsening Chronicity:  New Context: falling and recent injury   Relieved by:  Nothing Ineffective treatments:  None tried Associated  symptoms: dysuria   Associated symptoms: no abdominal pain, no chest pain and no headaches     Past Medical History  Diagnosis Date  . Diabetes mellitus without complication ([place])   . Bipolar disorder ([place])   . Hypercholesteremia   . Stroke ([place])   . Degenerative disc disease, lumbar   . Pancreatitis, acute   . DDD (degenerative disc disease), cervical   . Neuromuscular disorder ([place])   . GERD (gastroesophageal reflux disease)   . Osteoporosis   . Proteinuria   . Barrett esophagus    Past Surgical History  Procedure Laterality Date  . Tubal ligation    . Bladder exstrophy closure    . Cesarean section    . Microdisectomy tubular w/root decomp    . Mohs surgery    . Removal left axillary mass    . Colonoscopy    . Colonoscopy with propofol N/A 07/17/2015    Procedure: COLONOSCOPY WITH PROPOFOL;  Surgeon: Lollie Sails, MD;  Location: [facility] ENDOSCOPY;  Service: Endoscopy;  Laterality: N/A;  . Esophagogastroduodenoscopy (egd) with propofol N/A 07/17/2015    Procedure: ESOPHAGOGASTRODUODENOSCOPY (EGD) WITH PROPOFOL;  Surgeon: Lollie Sails, MD;  Location: [facility] ENDOSCOPY;  Service: Endoscopy;  Laterality: N/A;   History reviewed. No pertinent family history. Social History  Substance Use Topics  . Smoking status: Current Every Day Smoker -- 0.25 packs/day    Types: Cigarettes  . Smokeless tobacco: Never Used  . Alcohol Use: None   OB History    No data available     Review of Systems  Respiratory: Negative for shortness of breath.   Cardiovascular: Negative for chest pain.  Gastrointestinal: Positive for nausea. Negative for abdominal pain.  Genitourinary: Positive for dysuria and frequency.  Musculoskeletal: Positive for back pain.  Neurological: Negative for headaches.  All other systems reviewed and are negative.   Allergies  Review of patient's allergies indicates no known allergies.  Home Medications   Prior to Admission medications   Medication Sig  Start Date End Date Taking? Authorizing Provider  aspirin EC 81 MG tablet Take 81 mg by mouth daily.   Yes Historical Provider, MD  clonazePAM (KLONOPIN) 1 MG tablet Take 1 mg by mouth 2 (two) times daily.   Yes Historical Provider, MD  esomeprazole (NEXIUM) 40 MG capsule Take 40 mg by mouth daily at 12 noon.   Yes Historical Provider, MD  gabapentin (NEURONTIN) 100 MG capsule Take 100 mg by mouth 2 (two) times daily.   Yes Historical Provider, MD  glimepiride (AMARYL) 2 MG tablet Take 2 mg by mouth 2 (two) times daily.   Yes Historical Provider, MD  insulin glargine (LANTUS) 100 UNIT/ML injection Inject 16 Units into the skin at bedtime.   Yes Historical Provider, MD  losartan (COZAAR) 50 MG tablet Take 50 mg by mouth daily.   Yes Historical Provider, MD  metFORMIN (GLUCOPHAGE) 500 MG tablet Take 1,000 mg by mouth 2 (two) times daily with a meal.   Yes Historical Provider, MD  nitroGLYCERIN (NITROSTAT) 0.4 MG SL tablet Place 0.4 mg under the tongue every 5 (five) minutes as needed for chest pain.   Yes Historical Provider, MD  ranitidine (ZANTAC) 150 MG capsule Take 150 mg by mouth 2 (two) times daily.   Yes Historical Provider, MD  simvastatin (ZOCOR) 40 MG tablet Take 40 mg by mouth daily.   Yes Historical Provider, MD  traZODone (DESYREL) 50 MG tablet Take 50 mg by mouth at bedtime.   Yes Historical Provider, MD  ciprofloxacin (CIPRO) 500 MG tablet Take 1 tablet (500 mg total) by mouth 2 (two) times daily. 11/12/15   Frederich Cha, MD  meloxicam (MOBIC) 15 MG tablet Take 1 tablet (15 mg total) by mouth daily. 11/12/15   Frederich Cha, MD  phenazopyridine (PYRIDIUM) 200 MG tablet Take 1 tablet (200 mg total) by mouth 3 (three) times daily as needed for pain. 11/12/15   Frederich Cha, MD  senna (SENOKOT) 8.6 MG tablet Take 1 tablet by mouth daily.    Historical Provider, MD  tiZANidine (ZANAFLEX) 4 MG tablet Take 1 tablet (4 mg total) by mouth every 6 (six) hours as needed for muscle spasms. 11/12/15    Frederich Cha, MD  traMADol (ULTRAM) 50 MG tablet Take 1 tablet (50 mg total) by mouth every 6 (six) hours as needed. 11/12/15   Frederich Cha, MD   Meds Ordered and Administered this Visit   Medications  ondansetron (ZOFRAN-ODT) disintegrating tablet 8 mg (8 mg Oral Given 11/12/15 1323)  ketorolac (TORADOL) injection 60 mg (60 mg Intramuscular Given 11/12/15 1323)    BP 124/57 mmHg  Pulse 73  Temp(Src) 97.4 F (36.3 C) (Oral)  Resp 20  Ht 5\' 4"  (1.626 m)  Wt 124 lb (56.246 kg)  BMI 21.27 kg/m2  SpO2 99% No data found.   Physical Exam  Constitutional: She is oriented to person, place, and time. Vital signs are normal.  White female older than stated age  HENT:  Head: Normocephalic.  Left Ear: External ear normal.  Eyes: Conjunctivae and EOM are normal. Pupils are equal, round, and reactive to light.  Neck: Normal range of motion. Neck  supple.  Abdominal: Soft. She exhibits no distension.  Musculoskeletal: She exhibits tenderness.       Lumbar back: She exhibits bony tenderness and pain.       Back:  Neurological: She is alert and oriented to person, place, and time.  Skin: Skin is warm and dry. No erythema.  Psychiatric: She has a normal mood and affect.  Vitals reviewed.   ED Course  Procedures (including critical care time)  Labs Review Labs Reviewed  URINALYSIS COMPLETEWITH MICROSCOPIC ([facility] ONLY) - Abnormal; Notable for the following:    APPearance CLOUDY (*)    Bilirubin Urine 1+ (*)    Hgb urine dipstick TRACE (*)    Protein, ur 30 (*)    Leukocytes, UA 1+ (*)    Bacteria, UA MANY (*)    Squamous Epithelial / LPF 6-30 (*)    All other components within normal limits  URINE CULTURE    Imaging Review Dg Lumbar Spine Complete  11/12/2015  CLINICAL DATA:  Fall in bathroom 1 week ago. Pain in tail bone and right iliac crest. EXAM: LUMBAR SPINE - COMPLETE 4+ VIEW COMPARISON:  Sacrum radiographs from the same day. FINDINGS: Facet degenerative changes are most  pronounced at L5-S1. Mild osteopenia is present. Atherosclerotic calcifications are present at the aorta and branch vessels. The bowel gas pattern is normal. Vertebral body heights and alignment are maintained. IMPRESSION: 1. Moderate degenerative change at L5-S1. 2. No acute abnormality. Electronically Signed   By: [place] Morelle M.D.   On: 11/12/2015 14:31   Dg Sacrum/coccyx  11/12/2015  CLINICAL DATA:  Status post fall 1 week ago on the bathroom with a blow to the coccyx. Continued pain. Initial encounter. EXAM: SACRUM AND COCCYX - 2+ VIEW COMPARISON:  None. FINDINGS: There is no evidence of fracture or other focal bone lesions. IMPRESSION: Negative exam. Electronically Signed   By: Inge Rise M.D.   On: 11/12/2015 14:30     Visual Acuity Review  Right Eye Distance:   Left Eye Distance:   Bilateral Distance:    Right Eye Near:   Left Eye Near:    Bilateral Near:       Results for orders placed or performed during the hospital encounter of 11/12/15  Urinalysis complete, with microscopic  Result Value Ref Range   Color, Urine YELLOW YELLOW   APPearance CLOUDY (A) CLEAR   Glucose, UA NEGATIVE NEGATIVE mg/dL   Bilirubin Urine 1+ (A) NEGATIVE   Ketones, ur NEGATIVE NEGATIVE mg/dL   Specific Gravity, Urine 1.025 1.005 - 1.030   Hgb urine dipstick TRACE (A) NEGATIVE   pH 5.5 5.0 - 8.0   Protein, ur 30 (A) NEGATIVE mg/dL   Nitrite NEGATIVE NEGATIVE   Leukocytes, UA 1+ (A) NEGATIVE   RBC / HPF 0-5 0 - 5 RBC/hpf   WBC, UA 21-50 0 - 5 WBC/hpf   Bacteria, UA MANY (A) NONE SEEN   Squamous Epithelial / LPF 6-30 (A) NONE SEEN    MDM   1. Contusion of sacral region, initial encounter   2. Coccygeal contusion, initial encounter   3. UTI (lower urinary tract infection)   4. Lumbar and sacral osteoarthritis     Patient has been informed no fracture. She has a contusion of the tailbone and lumbar spine. In this office arthritis present as well. We'll treat that with Mobic and  Zanaflex and tramadol for acute pain. We'll place on Cipro and Pyridium for UTI. Follow-up PCP if not better in a week  Frederich Cha, MD 11/12/15 [phone]

## 2015-11-12 NOTE — ED Notes (Signed)
Patient fell and hurt her tailbone on 10/05/15 has symptoms of a UTI. Patient has a history of UTI's and falls.

## 2015-11-12 NOTE — Discharge Instructions (Signed)
Back Injury Prevention Back injuries can be very painful. They can also be difficult to heal. After having one back injury, you are more likely to injure your back again. It is important to learn how to avoid injuring or re-injuring your back. The following tips can help you to prevent a back injury. WHAT SHOULD I KNOW ABOUT PHYSICAL FITNESS?  Exercise for 30 minutes per day on most days of the week or as told by your doctor. Make sure to:  Do aerobic exercises, such as walking, jogging, biking, or swimming.  Do exercises that increase balance and strength, such as tai chi and yoga.  Do stretching exercises. This helps with flexibility.  Try to develop strong belly (abdominal) muscles. Your belly muscles help to support your back.  Stay at a healthy weight. This helps to decrease your risk of a back injury. WHAT SHOULD I KNOW ABOUT MY DIET?  Talk with your doctor about your overall diet. Take supplements and vitamins only as told by your doctor.  Talk with your doctor about how much calcium and vitamin D you need each day. These nutrients help to prevent weakening of the bones (osteoporosis).  Include good sources of calcium in your diet, such as:  Dairy products.  Green leafy vegetables.  Products that have had calcium added to them (fortified).  Include good sources of vitamin D in your diet, such as:  Milk.  Foods that have had vitamin D added to them. WHAT SHOULD I KNOW ABOUT MY POSTURE?  Sit up straight and stand up straight. Avoid leaning forward when you sit or hunching over when you stand.  Choose chairs that have good low-back (lumbar) support.  If you work at a desk, sit close to it so you do not need to lean over. Keep your chin tucked in. Keep your neck drawn back. Keep your elbows bent so your arms look like the letter "L" (right angle).  Sit high and close to the steering wheel when you drive. Add a low-back support to your car seat, if needed.  Avoid sitting  or standing in one position for very long. Take breaks to get up, stretch, and walk around at least one time every hour. Take breaks every hour if you are driving for long periods of time.  Sleep on your side with your knees slightly bent, or sleep on your back with a pillow under your knees. Do not lie on the front of your body to sleep. WHAT SHOULD I KNOW ABOUT LIFTING, TWISTING, AND REACHING Lifting and Heavy Lifting  Avoid heavy lifting, especially lifting over and over again. If you must do heavy lifting:  Stretch before lifting.  Work slowly.  Rest between lifts.  Use a tool such as a cart or a dolly to move objects if one is available.  Make several small trips instead of carrying one heavy load.  Ask for help when you need it, especially when moving big objects.  Follow these steps when lifting:  Stand with your feet shoulder-width apart.  Get as close to the object as you can. Do not pick up a heavy object that is far from your body.  Use handles or lifting straps if they are available.  Bend at your knees. Squat down, but keep your heels off the floor.  Keep your shoulders back. Keep your chin tucked in. Keep your back straight.  Lift the object slowly while you tighten the muscles in your legs, belly, and butt. Keep the object  as close to the center of your body as possible.  Follow these steps when putting down a heavy load:  Stand with your feet shoulder-width apart.  Lower the object slowly while you tighten the muscles in your legs, belly, and butt. Keep the object as close to the center of your body as possible.  Keep your shoulders back. Keep your chin tucked in. Keep your back straight.  Bend at your knees. Squat down, but keep your heels off the floor.  Use handles or lifting straps if they are available. Twisting and Reaching  Avoid lifting heavy objects above your waist.  Do not twist at your waist while you are lifting or carrying a load. If  you need to turn, move your feet.  Do not bend over without bending at your knees.  Avoid reaching over your head, across a table, or for an object on a high surface.  WHAT ARE SOME OTHER TIPS?  Avoid wet floors and icy ground. Keep sidewalks clear of ice to prevent falls.   Do not sleep on a mattress that is too soft or too hard.   Keep items that you use often within easy reach.   Put heavier objects on shelves at waist level, and put lighter objects on lower or higher shelves.  Find ways to lower your stress, such as:  Exercise.  Massage.  Relaxation techniques.  Talk with your doctor if you feel anxious or depressed. These conditions can make back pain worse.  Wear flat heel shoes with cushioned soles.  Avoid making quick (sudden) movements.  Use both shoulder straps when carrying a backpack.  Do not use any tobacco products, including cigarettes, chewing tobacco, or electronic cigarettes. If you need help quitting, ask your doctor.   This information is not intended to replace advice given to you by your health care provider. Make sure you discuss any questions you have with your health care provider.   Document Released: 04/04/2008 Document Revised: 03/03/2015 Document Reviewed: 10/21/2014 Elsevier Interactive Patient Education 2016 [employer]  Cryotherapy Cryotherapy is when you put ice on your injury. Ice helps lessen pain and puffiness (swelling) after an injury. Ice works the best when you start using it in the first 24 to 48 hours after an injury. HOME CARE  Put a dry or damp towel between the ice pack and your skin.  You may press gently on the ice pack.  Leave the ice on for no more than 10 to 20 minutes at a time.  Check your skin after 5 minutes to make sure your skin is okay.  Rest at least 20 minutes between ice pack uses.  Stop using ice when your skin loses feeling (numbness).  Do not use ice on someone who cannot tell you when it  hurts. This includes small children and people with memory problems (dementia). GET HELP RIGHT AWAY IF:  You have white spots on your skin.  Your skin turns blue or pale.  Your skin feels waxy or hard.  Your puffiness gets worse. MAKE SURE YOU:   Understand these instructions.  Will watch your condition.  Will get help right away if you are not doing well or get worse.   This information is not intended to replace advice given to you by your health care provider. Make sure you discuss any questions you have with your health care provider.   Document Released: 04/04/2008 Document Revised: 01/09/2012 Document Reviewed: 06/09/2011 Elsevier Interactive Patient Education [employer].  Urinary Tract Infection A urinary tract infection (UTI) can occur any place along the urinary tract. The tract includes the kidneys, ureters, bladder, and urethra. A type of germ called bacteria often causes a UTI. UTIs are often helped with antibiotic medicine.  HOME CARE   If given, take antibiotics as told by your doctor. Finish them even if you start to feel better.  Drink enough fluids to keep your pee (urine) clear or pale yellow.  Avoid tea, drinks with caffeine, and bubbly (carbonated) drinks.  Pee often. Avoid holding your pee in for a long time.  Pee before and after having sex (intercourse).  Wipe from front to back after you poop (bowel movement) if you are a woman. Use each tissue only once. GET HELP RIGHT AWAY IF:   You have back pain.  You have lower belly (abdominal) pain.  You have chills.  You feel sick to your stomach (nauseous).  You throw up (vomit).  Your burning or discomfort with peeing does not go away.  You have a fever.  Your symptoms are not better in 3 days. MAKE SURE YOU:   Understand these instructions.  Will watch your condition.  Will get help right away if you are not doing well or get worse.   This information is not intended to replace  advice given to you by your health care provider. Make sure you discuss any questions you have with your health care provider.   Document Released: 04/04/2008 Document Revised: 11/07/2014 Document Reviewed: 05/17/2012 Elsevier Interactive Patient Education 2016 [place] Injury The tailbone is the small bone at the lower end of the backbone (spine). You may have stretched tissues, bruises, or a broken bone (fracture). These injuries can be painful. Most tailbone injuries get better on their own in 4-6 weeks. HOME CARE  Take medicines only as told by your doctor.  If told, apply ice to the injured area.  Put ice in a plastic bag.  Place a towel between your skin and the bag.  Leave the ice on for 20 minutes, 2-3 times per day. Do this for the first 1-2 days.  Sit on a large, rubber or inflated ring or cushion to lessen pain. Lean forward when you sit to help lessen pain.  Avoid sitting in one place for a long time.  Increase your activity as the pain allows.  Do exercises as told by your doctor or physical therapist.  If it is painful to poop, take medicine to help you poop (stool softeners) as told by your doctor.  Eat foods that have plenty of fiber.  Keep all follow-up visits as told by your doctor. This is important. GET HELP IF:  Your pain gets worse.  Pooping causes you pain.  You cannot poop (constipation).  You are leaking pee (urinary incontinence).  You have a fever.   This information is not intended to replace advice given to you by your health care provider. Make sure you discuss any questions you have with your health care provider.   Document Released: 11/19/2010 Document Revised: 03/03/2015 Document Reviewed: 10/13/2014 Elsevier Interactive Patient Education [employer].

## 2015-11-13 ENCOUNTER — Telehealth: Payer: Self-pay

## 2015-11-13 NOTE — ED Notes (Signed)
[facility] in [place] left message stating that "Cipro interacts with Tizanidine and wants clarification". Please advise

## 2015-11-14 LAB — URINE CULTURE: Culture: >=100000

## 2015-11-15 ENCOUNTER — Telehealth: Payer: Self-pay

## 2015-11-15 NOTE — ED Notes (Signed)
Patient called for urine culture results-positive for E-coli and sensitive to Cipro. States is feeling much better. Instructed to continue taking Cipro until completely finished

## 2015-11-16 NOTE — Telephone Encounter (Signed)
Received this note on patient call wasn't sure if he actually was seen by me. Dr. Frederich Cha

## 2016-03-08 ENCOUNTER — Ambulatory Visit
Admission: EM | Admit: 2016-03-08 | Discharge: 2016-03-08 | Disposition: A | Discharge status: Home / Self Care | Payer: Medicare Other | Attending: Family Medicine | Admitting: Family Medicine

## 2016-03-08 DIAGNOSIS — Z720 Tobacco use: Secondary | ICD-10-CM | POA: Diagnosis not present

## 2016-03-08 DIAGNOSIS — J219 Acute bronchiolitis, unspecified: Secondary | ICD-10-CM

## 2016-03-08 DIAGNOSIS — J441 Chronic obstructive pulmonary disease with (acute) exacerbation: Secondary | ICD-10-CM

## 2016-03-08 MED ORDER — PREDNISONE 10 MG (21) PO TBPK: ORAL_TABLET | ORAL | Status: DC

## 2016-03-08 MED ORDER — BENZONATATE 200 MG PO CAPS: 200.0000 mg | ORAL_CAPSULE | Freq: Three times a day (TID) | ORAL | Status: DC | PRN

## 2016-03-08 MED ORDER — AZITHROMYCIN 250 MG PO TABS: ORAL_TABLET | ORAL | Status: DC

## 2016-03-08 MED ORDER — ALBUTEROL SULFATE HFA 108 (90 BASE) MCG/ACT IN AERS: 2.0000 | INHALATION_SPRAY | RESPIRATORY_TRACT | Status: AC | PRN

## 2016-03-08 MED ORDER — METHYLPREDNISOLONE SODIUM SUCC 125 MG IJ SOLR
125.0000 mg | Freq: Once | INTRAMUSCULAR | Status: AC
  Administered 2016-03-08: 125 mg via INTRAMUSCULAR

## 2016-03-08 NOTE — ED Provider Notes (Signed)
CSN: [id number]     Arrival date & time 03/08/16  1338 History   First MD Initiated Contact with Patient 03/08/16 1353    Nurses notes were reviewed. Chief Complaint  Patient presents with  . Nasal Congestion    Cough of green phlegm, runny nose and scratchy throat. Pain 6/10   Patient's here because of nasal congestion but the main complaint she gives me his difficulty breathing and shortness of breath. Everything started yesterday. She states that she's had to stop smoking for the last day or 2 however she still smells of smoke. His history of COPD stroke bipolar disease and she still smokes. Along with other medical problems that she has she's had some incontinence and frequency and she has a urology appointment pending for further evaluation for that problem. She states she's coughing up sputum and she is wheezing as well. She also has history of diabetes and was warned of replacing prednisone may exacerbate her diabetes.  No significant family medical history relating to her current problem and no known drug allergies.  (Consider location/radiation/quality/duration/timing/severity/associated sxs/prior Treatment) Patient is a 65 y.o. female presenting with cough. The history is provided by the patient. No language interpreter was used.  Cough Cough characteristics:  Productive Severity:  Moderate Duration:  3 days Timing:  Constant Progression:  Worsening Chronicity:  New Context: upper respiratory infection   Relieved by:  Nothing Ineffective treatments:  None tried Associated symptoms: shortness of breath, sinus congestion and wheezing     Past Medical History  Diagnosis Date  . Diabetes mellitus without complication ([place])   . Bipolar disorder ([place])   . Hypercholesteremia   . Stroke ([place])   . Degenerative disc disease, lumbar   . Pancreatitis, acute   . DDD (degenerative disc disease), cervical   . Neuromuscular disorder ([place])   . GERD (gastroesophageal reflux disease)    . Osteoporosis   . Proteinuria   . Barrett esophagus    Past Surgical History  Procedure Laterality Date  . Tubal ligation    . Bladder exstrophy closure    . Cesarean section    . Microdisectomy tubular w/root decomp    . Mohs surgery    . Removal left axillary mass    . Colonoscopy    . Colonoscopy with propofol N/A 07/17/2015    Procedure: COLONOSCOPY WITH PROPOFOL;  Surgeon: Lollie Sails, MD;  Location: [facility] END[facility];  Service: Endoscopy;  Laterality: N/A;  . Esophagogastroduodenoscopy (egd) with propofol N/A 07/17/2015    Procedure: ESOPHAGOGASTRODUODENOSCOPY (EGD) WITH PROPOFOL;  Surgeon: Lollie Sails, MD;  Location: [facility] ENDOSCOPY;  Service: Endoscopy;  Laterality: N/A;   History reviewed. No pertinent family history. Social History  Substance Use Topics  . Smoking status: Current Every Day Smoker -- 0.50 packs/day    Types: Cigarettes  . Smokeless tobacco: Never Used  . Alcohol Use: No   OB History    No data available     Review of Systems  Respiratory: Positive for cough, shortness of breath and wheezing.   All other systems reviewed and are negative.   Allergies  Review of patient's allergies indicates no known allergies.  Home Medications   Prior to Admission medications   Medication Sig Start Date End Date Taking? Authorizing Provider  aspirin EC 81 MG tablet Take 81 mg by mouth daily.   Yes Historical Provider, MD  clonazePAM (KLONOPIN) 1 MG tablet Take 1 mg by mouth 2 (two) times daily.   Yes Historical Provider, MD  esomeprazole (NEXIUM) 40 MG capsule Take 40 mg by mouth daily at 12 noon.   Yes Historical Provider, MD  gabapentin (NEURONTIN) 100 MG capsule Take 100 mg by mouth 2 (two) times daily.   Yes Historical Provider, MD  glimepiride (AMARYL) 2 MG tablet Take 2 mg by mouth 2 (two) times daily.   Yes Historical Provider, MD  insulin glargine (LANTUS) 100 UNIT/ML injection Inject 16 Units into the skin at bedtime.   Yes Historical  Provider, MD  losartan (COZAAR) 50 MG tablet Take 50 mg by mouth daily.   Yes Historical Provider, MD  meloxicam (MOBIC) 15 MG tablet Take 1 tablet (15 mg total) by mouth daily. 11/12/15  Yes Frederich Cha, MD  nitroGLYCERIN (NITROSTAT) 0.4 MG SL tablet Place 0.4 mg under the tongue every 5 (five) minutes as needed for chest pain.   Yes Historical Provider, MD  phenazopyridine (PYRIDIUM) 200 MG tablet Take 1 tablet (200 mg total) by mouth 3 (three) times daily as needed for pain. 11/12/15  Yes Frederich Cha, MD  simvastatin (ZOCOR) 40 MG tablet Take 40 mg by mouth daily.   Yes Historical Provider, MD  traZODone (DESYREL) 50 MG tablet Take 50 mg by mouth at bedtime.   Yes Historical Provider, MD  albuterol (PROVENTIL HFA;VENTOLIN HFA) 108 (90 Base) MCG/ACT inhaler Inhale 2 puffs into the lungs every 4 (four) hours as needed for wheezing or shortness of breath. 03/08/16   Frederich Cha, MD  azithromycin (ZITHROMAX Z-PAK) 250 MG tablet Take 2 tablets first day and then 1 po a day for 4 days 03/08/16   Frederich Cha, MD  benzonatate (TESSALON) 200 MG capsule Take 1 capsule (200 mg total) by mouth 3 (three) times daily as needed for cough. 03/08/16   Frederich Cha, MD  ciprofloxacin (CIPRO) 500 MG tablet Take 1 tablet (500 mg total) by mouth 2 (two) times daily. 11/12/15   Frederich Cha, MD  metFORMIN (GLUCOPHAGE) 500 MG tablet Take 1,000 mg by mouth 2 (two) times daily with a meal.    Historical Provider, MD  predniSONE (STERAPRED UNI-PAK 21 TAB) 10 MG (21) TBPK tablet Sig 6 tablet day 1, 5 tablets day 2, 4 tablets day 3,,3tablets day 4, 2 tablets day 5, 1 tablet day 6 take all tablets orally 03/08/16   Frederich Cha, MD  ranitidine (ZANTAC) 150 MG capsule Take 150 mg by mouth 2 (two) times daily.    Historical Provider, MD  senna (SENOKOT) 8.6 MG tablet Take 1 tablet by mouth daily.    Historical Provider, MD  tiZANidine (ZANAFLEX) 4 MG tablet Take 1 tablet (4 mg total) by mouth every 6 (six) hours as needed for muscle spasms.  11/12/15   Frederich Cha, MD  traMADol (ULTRAM) 50 MG tablet Take 1 tablet (50 mg total) by mouth every 6 (six) hours as needed. 11/12/15   Frederich Cha, MD   Meds Ordered and Administered this Visit   Medications  methylPREDNISolone sodium succinate (SOLU-MEDROL) 125 mg/2 mL injection 125 mg (125 mg Intramuscular Given 03/08/16 1426)    BP 148/73 mmHg  Pulse 66  Temp(Src) 97.7 F (36.5 C) (Oral)  Resp 18  Ht 5\' 5"  (1.651 m)  Wt 120 lb (54.432 kg)  BMI 19.97 kg/m2  SpO2 100% No data found.   Physical Exam  Constitutional: She is oriented to person, place, and time. She appears well-developed and well-nourished.  HENT:  Head: Normocephalic.  Eyes: Pupils are equal, round, and reactive to light.  Neck: Normal range of  motion.  Cardiovascular: Normal rate, regular rhythm and normal heart sounds.   Pulmonary/Chest: She has wheezes.  Musculoskeletal: Normal range of motion.  Neurological: She is alert and oriented to person, place, and time.  Skin: Skin is warm and dry.  Psychiatric: She has a normal mood and affect.  Vitals reviewed.   ED Course  Procedures (including critical care time)  Labs Review Labs Reviewed - No data to display  Imaging Review No results found.   Visual Acuity Review  Right Eye Distance:   Left Eye Distance:   Bilateral Distance:    Right Eye Near:   Left Eye Near:    Bilateral Near:         MDM   1. COPD with acute exacerbation ([place])   2. Acute bronchiolitis with bronchospasm   3. Continuous tobacco abuse    Patient was warned she need to stop her smoking ASAP. Will give her a breathing treatment while she is here and then placed on oral prednisone, I be her inhaler to use at home Tessalon Perles for cough warned her that her Medicaid insurance will not cover cough medication this will have to be out of her own pocket and placed on a Z-Pak. Follow-up PCP 1-2 weeks not better and once again she needs to stop smoking.   Note: This  dictation was prepared with Dragon dictation along with smaller phrase technology. Any transcriptional errors that result from this process are unintentional.    Frederich Cha, MD 03/08/16 1433

## 2016-03-08 NOTE — Discharge Instructions (Signed)
Asthma, Acute Bronchospasm °Acute bronchospasm caused by asthma is also referred to as an asthma attack. Bronchospasm means your air passages become narrowed. The narrowing is caused by inflammation and tightening of the muscles in the air tubes (bronchi) in your lungs. This can make it hard to breathe or cause you to wheeze and cough. °CAUSES °Possible triggers are: °· Animal dander from the skin, hair, or feathers of animals. °· Dust mites contained in house dust. °· Cockroaches. °· Pollen from trees or grass. °· Mold. °· Cigarette or tobacco smoke. °· Air pollutants such as dust, household cleaners, hair sprays, aerosol sprays, paint fumes, strong chemicals, or strong odors. °· Cold air or weather changes. Cold air may trigger inflammation. Winds increase molds and pollens in the air. °· Strong emotions such as crying or laughing hard. °· Stress. °· Certain medicines such as aspirin or beta-blockers. °· Sulfites in foods and drinks, such as dried fruits and wine. °· Infections or inflammatory conditions, such as a flu, cold, or inflammation of the nasal membranes (rhinitis). °· Gastroesophageal reflux disease (GERD). GERD is a condition where stomach acid backs up into your esophagus. °· Exercise or strenuous activity. °SIGNS AND SYMPTOMS  °· Wheezing. °· Excessive coughing, particularly at night. °· Chest tightness. °· Shortness of breath. °DIAGNOSIS  °Your health care provider will ask you about your medical history and perform a physical exam. A chest X-ray or blood testing may be performed to look for other causes of your symptoms or other conditions that may have triggered your asthma attack.  °TREATMENT  °Treatment is aimed at reducing inflammation and opening up the airways in your lungs.  Most asthma attacks are treated with inhaled medicines. These include quick relief or rescue medicines (such as bronchodilators) and controller medicines (such as inhaled corticosteroids). These medicines are sometimes  given through an inhaler or a nebulizer. Systemic steroid medicine taken by mouth or given through an IV tube also can be used to reduce the inflammation when an attack is moderate or severe. Antibiotic medicines are only used if a bacterial infection is present.  °HOME CARE INSTRUCTIONS  °· Rest. °· Drink plenty of liquids. This helps the mucus to remain thin and be easily coughed up. Only use caffeine in moderation and do not use alcohol until you have recovered from your illness. °· Do not smoke. Avoid being exposed to secondhand smoke. °· You play a critical role in keeping yourself in good health. Avoid exposure to things that cause you to wheeze or to have breathing problems. °· Keep your medicines up-to-date and available. Carefully follow your health care provider's treatment plan. °· Take your medicine exactly as prescribed. °· When pollen or pollution is bad, keep windows closed and use an air conditioner or go to places with air conditioning. °· Asthma requires careful medical care. See your health care provider for a follow-up as advised. If you are more than [redacted] weeks pregnant and you were prescribed any new medicines, let your obstetrician know about the visit and how you are doing. Follow up with your health care provider as directed. °· After you have recovered from your asthma attack, make an appointment with your outpatient doctor to talk about ways to reduce the likelihood of future attacks. If you do not have a doctor who manages your asthma, make an appointment with a primary care doctor to discuss your asthma. °SEEK IMMEDIATE MEDICAL CARE IF:  °· You are getting worse. °· You have trouble breathing. If severe, call your local   emergency services (911 in the [place]  You develop chest pain or discomfort.  You are vomiting.  You are not able to keep fluids down.  You are coughing up yellow, green, brown, or bloody sputum.  You have a fever and your symptoms suddenly get worse.  You have  trouble swallowing. MAKE SURE YOU:   Understand these instructions.  Will watch your condition.  Will get help right away if you are not doing well or get worse.   This information is not intended to replace advice given to you by your health care provider. Make sure you discuss any questions you have with your health care provider.   Document Released: 02/01/2007 Document Revised: 10/22/2013 Document Reviewed: 04/24/2013 Elsevier Interactive Patient Education 2016 Else[employer] Inc.  Chronic Obstructive Pulmonary Disease Exacerbation Chronic obstructive pulmonary disease (COPD) is a common lung problem. In COPD, the flow of air from the lungs is limited. COPD exacerbations are times that breathing gets worse and you need extra treatment. Without treatment they can be life threatening. If they happen often, your lungs can become more damaged. If your COPD gets worse, your doctor may treat you with:  Medicines.  Oxygen.  Different ways to clear your airway, such as using a mask. HOME CARE  Do not smoke.  Avoid tobacco smoke and other things that bother your lungs.  If given, take your antibiotic medicine as told. Finish the medicine even if you start to feel better.  Only take medicines as told by your doctor.  Drink enough fluids to keep your pee (urine) clear or pale yellow (unless your doctor has told you not to).  Use a cool mist machine (vaporizer).  If you use oxygen or a machine that turns liquid medicine into a mist (nebulizer), continue to use them as told.  Keep up with shots (vaccinations) as told by your doctor.  Exercise regularly.  Eat healthy foods.  Keep all doctor visits as told. GET HELP RIGHT AWAY IF:  You are very short of breath and it gets worse.  You have trouble talking.  You have bad chest pain.  You have blood in your spit (sputum).  You have a fever.  You keep throwing up (vomiting).  You feel weak, or you pass out (faint).  You feel  confused.  You keep getting worse. MAKE SURE YOU:  Understand these instructions.  Will watch your condition.  Will get help right away if you are not doing well or get worse.   This information is not intended to replace advice given to you by your health care provider. Make sure you discuss any questions you have with your health care provider.   Document Released: 10/06/2011 Document Revised: 11/07/2014 Document Reviewed: 06/21/2013 Elsevier Interactive Patient Education 2016 Elsevier Inc.  Cough, Adult A cough helps to clear your throat and lungs. A cough may last only 2-3 weeks (acute), or it may last longer than 8 weeks (chronic). Many different things can cause a cough. A cough may be a sign of an illness or another medical condition. HOME CARE  Pay attention to any changes in your cough.  Take medicines only as told by your doctor.  If you were prescribed an antibiotic medicine, take it as told by your doctor. Do not stop taking it even if you start to feel better.  Talk with your doctor before you try using a cough medicine.  Drink enough fluid to keep your pee (urine) clear or pale yellow.  If the  air is dry, use a cold steam vaporizer or humidifier in your home.  Stay away from things that make you cough at work or at home.  If your cough is worse at night, try using extra pillows to raise your head up higher while you sleep.  Do not smoke, and try not to be around smoke. If you need help quitting, ask your doctor.  Do not have caffeine.  Do not drink alcohol.  Rest as needed. GET HELP IF:  You have new problems (symptoms).  You cough up yellow fluid (pus).  Your cough does not get better after 2-3 weeks, or your cough gets worse.  Medicine does not help your cough and you are not sleeping well.  You have pain that gets worse or pain that is not helped with medicine.  You have a fever.  You are losing weight and you do not know why.  You have  night sweats. GET HELP RIGHT AWAY IF:  You cough up blood.  You have trouble breathing.  Your heartbeat is very fast.   This information is not intended to replace advice given to you by your health care provider. Make sure you discuss any questions you have with your health care provider.   Document Released: 06/30/2011 Document Revised: 07/08/2015 Document Reviewed: 12/24/2014 Elsevier Interactive Patient Education 2016 [employer].  Smoking Cessation, Tips for Success If you are ready to quit smoking, congratulations! You have chosen to help yourself be healthier. Cigarettes bring nicotine, tar, carbon monoxide, and other irritants into your body. Your lungs, heart, and blood vessels will be able to work better without these poisons. There are many different ways to quit smoking. Nicotine gum, nicotine patches, a nicotine inhaler, or nicotine nasal spray can help with physical craving. Hypnosis, support groups, and medicines help break the habit of smoking. WHAT THINGS CAN I DO TO MAKE QUITTING EASIER?  Here are some tips to help you quit for good:  Pick a date when you will quit smoking completely. Tell all of your friends and family about your plan to quit on that date.  Do not try to slowly cut down on the number of cigarettes you are smoking. Pick a quit date and quit smoking completely starting on that day.  Throw away all cigarettes.   Clean and remove all ashtrays from your home, work, and car.  On a card, write down your reasons for quitting. Carry the card with you and read it when you get the urge to smoke.  Cleanse your body of nicotine. Drink enough water and fluids to keep your urine clear or pale yellow. Do this after quitting to flush the nicotine from your body.  Learn to predict your moods. Do not let a bad situation be your excuse to have a cigarette. Some situations in your life might tempt you into wanting a cigarette.  Never have "just one" cigarette. It  leads to wanting another and another. Remind yourself of your decision to quit.  Change habits associated with smoking. If you smoked while driving or when feeling stressed, try other activities to replace smoking. Stand up when drinking your coffee. Brush your teeth after eating. Sit in a different chair when you read the paper. Avoid alcohol while trying to quit, and try to drink fewer caffeinated beverages. Alcohol and caffeine may urge you to smoke.  Avoid foods and drinks that can trigger a desire to smoke, such as sugary or spicy foods and alcohol.  Ask people  who smoke not to smoke around you.  Have something planned to do right after eating or having a cup of coffee. For example, plan to take a walk or exercise.  Try a relaxation exercise to calm you down and decrease your stress. Remember, you may be tense and nervous for the first 2 weeks after you quit, but this will pass.  Find new activities to keep your hands busy. Play with a pen, coin, or rubber band. Doodle or draw things on paper.  Brush your teeth right after eating. This will help cut down on the craving for the taste of tobacco after meals. You can also try mouthwash.   Use oral substitutes in place of cigarettes. Try using lemon drops, carrots, cinnamon sticks, or chewing gum. Keep them handy so they are available when you have the urge to smoke.  When you have the urge to smoke, try deep breathing.  Designate your home as a nonsmoking area.  If you are a heavy smoker, ask your health care provider about a prescription for nicotine chewing gum. It can ease your withdrawal from nicotine.  Reward yourself. Set aside the cigarette money you save and buy yourself something nice.  Look for support from others. Join a support group or smoking cessation program. Ask someone at home or at work to help you with your plan to quit smoking.  Always ask yourself, "Do I need this cigarette or is this just a reflex?" Tell  yourself, "Today, I choose not to smoke," or "I do not want to smoke." You are reminding yourself of your decision to quit.  Do not replace cigarette smoking with electronic cigarettes (commonly called e-cigarettes). The safety of e-cigarettes is unknown, and some may contain harmful chemicals.  If you relapse, do not give up! Plan ahead and think about what you will do the next time you get the urge to smoke. HOW WILL I FEEL WHEN I QUIT SMOKING? You may have symptoms of withdrawal because your body is used to nicotine (the addictive substance in cigarettes). You may crave cigarettes, be irritable, feel very hungry, cough often, get headaches, or have difficulty concentrating. The withdrawal symptoms are only temporary. They are strongest when you first quit but will go away within 10-14 days. When withdrawal symptoms occur, stay in control. Think about your reasons for quitting. Remind yourself that these are signs that your body is healing and getting used to being without cigarettes. Remember that withdrawal symptoms are easier to treat than the major diseases that smoking can cause.  Even after the withdrawal is over, expect periodic urges to smoke. However, these cravings are generally short lived and will go away whether you smoke or not. Do not smoke! WHAT RESOURCES ARE AVAILABLE TO HELP ME QUIT SMOKING? Your health care provider can direct you to community resources or hospitals for support, which may include:  Group support.  Education.  Hypnosis.  Therapy.   This information is not intended to replace advice given to you by your health care provider. Make sure you discuss any questions you have with your health care provider.   Document Released: 07/15/2004 Document Revised: 11/07/2014 Document Reviewed: 04/04/2013 Elsevier Interactive Patient Education [employer].

## 2016-03-08 NOTE — ED Notes (Signed)
Patient shows no signs of adverse reaction to medication at this time.  

## 2016-06-30 ENCOUNTER — Ambulatory Visit
Admission: RE | Admit: 2016-06-30 | Discharge: 2016-06-30 | Disposition: A | Discharge status: Home / Self Care | Payer: Medicare Other | Source: Ambulatory Visit | Attending: Internal Medicine | Admitting: Internal Medicine

## 2016-06-30 ENCOUNTER — Other Ambulatory Visit: Payer: Self-pay | Admitting: Internal Medicine

## 2016-06-30 DIAGNOSIS — K8500 Idiopathic acute pancreatitis without necrosis or infection: Secondary | ICD-10-CM

## 2016-06-30 DIAGNOSIS — N2881 Hypertrophy of kidney: Secondary | ICD-10-CM | POA: Diagnosis not present

## 2016-06-30 DIAGNOSIS — D3502 Benign neoplasm of left adrenal gland: Secondary | ICD-10-CM | POA: Diagnosis not present

## 2016-06-30 DIAGNOSIS — I700 Atherosclerosis of aorta: Secondary | ICD-10-CM | POA: Insufficient documentation

## 2016-08-01 ENCOUNTER — Other Ambulatory Visit: Payer: Self-pay | Admitting: Gastroenterology

## 2016-08-01 DIAGNOSIS — K859 Acute pancreatitis without necrosis or infection, unspecified: Secondary | ICD-10-CM

## 2016-08-11 ENCOUNTER — Ambulatory Visit: Payer: Medicare Other

## 2016-08-15 ENCOUNTER — Other Ambulatory Visit: Payer: Self-pay | Admitting: Gastroenterology

## 2016-08-15 DIAGNOSIS — K859 Acute pancreatitis without necrosis or infection, unspecified: Secondary | ICD-10-CM

## 2016-08-16 ENCOUNTER — Ambulatory Visit
Admission: RE | Admit: 2016-08-16 | Discharge: 2016-08-16 | Disposition: A | Discharge status: Home / Self Care | Payer: Medicare Other | Source: Ambulatory Visit | Attending: Gastroenterology | Admitting: Gastroenterology

## 2016-08-16 DIAGNOSIS — D3502 Benign neoplasm of left adrenal gland: Secondary | ICD-10-CM | POA: Diagnosis not present

## 2016-08-16 DIAGNOSIS — K862 Cyst of pancreas: Secondary | ICD-10-CM | POA: Insufficient documentation

## 2016-08-16 DIAGNOSIS — K859 Acute pancreatitis without necrosis or infection, unspecified: Secondary | ICD-10-CM

## 2016-08-16 DIAGNOSIS — K861 Other chronic pancreatitis: Secondary | ICD-10-CM | POA: Diagnosis not present

## 2016-08-16 MED ORDER — GADOBENATE DIMEGLUMINE 529 MG/ML IV SOLN
10.0000 mL | Freq: Once | INTRAVENOUS | Status: AC | PRN
  Administered 2016-08-16: 10 mL via INTRAVENOUS

## 2016-09-05 ENCOUNTER — Other Ambulatory Visit: Payer: Self-pay | Admitting: Physical Medicine and Rehabilitation

## 2016-09-05 DIAGNOSIS — M5136 Other intervertebral disc degeneration, lumbar region: Secondary | ICD-10-CM

## 2016-09-09 ENCOUNTER — Ambulatory Visit (INDEPENDENT_AMBULATORY_CARE_PROVIDER_SITE_OTHER): Payer: Medicare Other | Admitting: Urology

## 2016-09-09 ENCOUNTER — Encounter: Payer: Self-pay | Admitting: Urology

## 2016-09-09 VITALS — BP 124/67 | HR 80 | Ht 65.0 in | Wt 114.0 lb

## 2016-09-09 DIAGNOSIS — N3941 Urge incontinence: Secondary | ICD-10-CM

## 2016-09-09 DIAGNOSIS — N393 Stress incontinence (female) (male): Secondary | ICD-10-CM

## 2016-09-09 DIAGNOSIS — K219 Gastro-esophageal reflux disease without esophagitis: Secondary | ICD-10-CM | POA: Insufficient documentation

## 2016-09-09 DIAGNOSIS — R350 Frequency of micturition: Secondary | ICD-10-CM

## 2016-09-09 LAB — BLADDER SCAN AMB NON-IMAGING: SCAN RESULT: 46

## 2016-09-09 MED ORDER — OXYBUTYNIN CHLORIDE ER 5 MG PO TB24: 10.0000 mg | ORAL_TABLET | Freq: Every day | ORAL | Status: DC

## 2016-09-09 NOTE — Progress Notes (Signed)
09/09/2016 1:32 PM   Kimberly Richardson 1950-12-15 [id number]  Referring provider: Ezequiel Kayser, MD [facility] [place], [place] [address]  Chief Complaint  Patient presents with  . Urinary Incontinence    HPI: 65 year old female referred for further evaluation of urinary incontinence.  Today, she complains of long-standing both urge and stress urinary incontinence.  She reports that over the past year, she's been wearing 5-8 diapers daily which are completely saturated. Before that, she was wearing approximate 10 pads a day for many years. She states that she will suddenly void a large amount sometimes without any warning. She also has some mild leakage when she laughs coughs and sneezes which is also long-standing.  She was on Detrol many years ago but this medication was stopped for unknown reasons. She does state that she thinks that this helped.  She did undergo a "bladder neck repair" at [facility] approximately 20 years ago. She cannot recall the name of the doctor who did the repair or what exactly was done. She did have a lower abdominal midline incision from this.  She states that this was supposed to help her urinary leakage which was helpful for about 2 years.  Her medical history of does report a history of bladder exstrophy repair but she denies this or ever having any procedures done on her bladder as a baby or young adult.  She denies being born with her bladder on her outside.  She is diabetic and has a personal history of stroke.  She is on multiple medications Including muscle relaxants, narcotics for chronic back pain.    She was seen and evaluated in the emergency room [facility] in August 2017 diagnosed with "urinary tract infection" although UA at that time only had trace leukocytes with greater than 20 squamous epithelial cells and ultimately grew mixed flora.  She reports that when she is diagnosed with a urinary tract infection, she is absolute and  no symptoms including no dysuria or increased urgency or frequency.  Postvoid residual today is 46 cc.  PMH: Past Medical History:  Diagnosis Date  . Barrett esophagus   . Bipolar disorder ([place])   . DDD (degenerative disc disease), cervical   . Degenerative disc disease, lumbar   . Diabetes mellitus without complication ([place])   . GERD (gastroesophageal reflux disease)   . Hypercholesteremia   . Neuromuscular disorder ([place])   . Osteoporosis   . Pancreatitis, acute   . Proteinuria   . Stroke [facility])     Surgical History: Past Surgical History:  Procedure Laterality Date  . BLADDER EXSTROPHY CLOSURE    . CESAREAN SECTION    . COLONOSCOPY    . COLONOSCOPY WITH PROPOFOL N/A 07/17/2015   Procedure: COLONOSCOPY WITH PROPOFOL;  Surgeon: Lollie Sails, MD;  Location: [facility] ENDOSCOPY;  Service: Endoscopy;  Laterality: N/A;  . ESOPHAGOGASTRODUODENOSCOPY (EGD) WITH PROPOFOL N/A 07/17/2015   Procedure: ESOPHAGOGASTRODUODENOSCOPY (EGD) WITH PROPOFOL;  Surgeon: Lollie Sails, MD;  Location: [facility] ENDOSCOPY;  Service: Endoscopy;  Laterality: N/A;  . microdisectomy tubular w/root decomp    . MOHS SURGERY    . removal left axillary mass    . TUBAL LIGATION      Home Medications:    Medication List       Accurate as of 09/09/16 11:59 PM. Always use your most recent med list.          albuterol 108 (90 Base) MCG/ACT inhaler Commonly known as:  PROVENTIL HFA;VENTOLIN HFA  Inhale 2 puffs into the lungs every 4 (four) hours as needed for wheezing or shortness of breath.   aspirin EC 81 MG tablet Take 81 mg by mouth daily.   clonazePAM 1 MG tablet Commonly known as:  KLONOPIN Take 1 mg by mouth 2 (two) times daily.   esomeprazole 40 MG capsule Commonly known as:  NEXIUM Take 40 mg by mouth daily at 12 noon.   gabapentin 100 MG capsule Commonly known as:  NEURONTIN Take 100 mg by mouth 2 (two) times daily.   glimepiride 2 MG tablet Commonly known as:  AMARYL Take 2 mg by  mouth 2 (two) times daily.   insulin glargine 100 UNIT/ML injection Commonly known as:  LANTUS Inject 16 Units into the skin at bedtime.   losartan 50 MG tablet Commonly known as:  COZAAR Take 50 mg by mouth daily.   meloxicam 15 MG tablet Commonly known as:  MOBIC Take 1 tablet (15 mg total) by mouth daily.   metFORMIN 500 MG tablet Commonly known as:  GLUCOPHAGE Take 1,000 mg by mouth 2 (two) times daily with a meal.   nitroGLYCERIN 0.4 MG SL tablet Commonly known as:  NITROSTAT Place 0.4 mg under the tongue every 5 (five) minutes as needed for chest pain.   oxybutynin 10 MG 24 hr tablet Commonly known as:  DITROPAN XL Take 1 tablet (10 mg total) by mouth at bedtime.   ranitidine 150 MG capsule Commonly known as:  ZANTAC Take 150 mg by mouth 2 (two) times daily.   senna 8.6 MG tablet Commonly known as:  SENOKOT Take 1 tablet by mouth daily.   simvastatin 40 MG tablet Commonly known as:  ZOCOR Take 40 mg by mouth daily.   tiZANidine 4 MG tablet Commonly known as:  ZANAFLEX Take 1 tablet (4 mg total) by mouth every 6 (six) hours as needed for muscle spasms.   traMADol 50 MG tablet Commonly known as:  ULTRAM Take 1 tablet (50 mg total) by mouth every 6 (six) hours as needed.   traZODone 50 MG tablet Commonly known as:  DESYREL Take 50 mg by mouth at bedtime.       Allergies: No Known Allergies  Family History: Family History  Problem Relation Age of Onset  . Bladder Cancer Neg Hx   . Kidney cancer Neg Hx     Social History:  reports that she has been smoking Cigarettes.  She has been smoking about 0.50 packs per day. She has never used smokeless tobacco. She reports that she does not drink alcohol or use drugs.  ROS: UROLOGY Frequent Urination?: Yes Hard to postpone urination?: Yes Burning/pain with urination?: Yes Get up at night to urinate?: Yes Leakage of urine?: Yes Urine stream starts and stops?: Yes Trouble starting stream?: No Do you have  to strain to urinate?: Yes Blood in urine?: No Urinary tract infection?: Yes Sexually transmitted disease?: No Injury to kidneys or bladder?: Yes Painful intercourse?: No Weak stream?: No Currently pregnant?: No Vaginal bleeding?: No Last menstrual period?: n  Gastrointestinal Nausea?: No Vomiting?: No Indigestion/heartburn?: Yes Diarrhea?: No Constipation?: Yes  Constitutional Fever: No Night sweats?: Yes Weight loss?: No Fatigue?: Yes  Skin Skin rash/lesions?: No Itching?: Yes  Eyes Blurred vision?: No Double vision?: No  Ears/Nose/Throat Sore throat?: No Sinus problems?: No  Hematologic/Lymphatic Swollen glands?: No Easy bruising?: No  Cardiovascular Leg swelling?: No Chest pain?: No  Respiratory Cough?: Yes Shortness of breath?: No  Endocrine Excessive thirst?: Yes  Musculoskeletal  Back pain?: Yes Joint pain?: Yes  Neurological Headaches?: No Dizziness?: Yes  Psychologic Depression?: Yes Anxiety?: Yes  Physical Exam: BP 124/67   Pulse 80   Ht _0  (1.651 m)   Wt 114 lb (51.7 kg)   BMI 18.97 kg/m   Constitutional:  Alert and oriented, No acute distress. HEENT:  AT, moist mucus membranes.  Trachea midline, no masses. Cardiovascular: No clubbing, cyanosis, or edema. Respiratory: Normal respiratory effort, no increased work of breathing. GI: Abdomen is soft, nontender, nondistended, no abdominal masses. Lower midline abdominal incision appreciated. GU: No CVA tenderness.  Skin: No rashes, bruises or suspicious lesions. Neurologic: Grossly intact, no focal deficits, moving all 4 extremities. Psychiatric: Normal mood and affect.  Laboratory Data: Comprehensive Metabolic Panel (CMP) (28/36/6294 12:12 PM) Comprehensive Metabolic Panel (CMP) (76/54/6503 12:12 PM)  Component Value Ref Range  Glucose 101 70 - 110 mg/dL  Sodium 140 136 - 145 mmol/L  Potassium 3.8 3.6 - 5.1 mmol/L  Chloride 103 97 - 109 mmol/L  Carbon Dioxide (CO2)  28.4 22.0 - 32.0 mmol/L  Urea Nitrogen (BUN) 12 7 - 25 mg/dL  Creatinine 0.7 0.6 - 1.1 mg/dL  Glomerular Filtration Rate (eGFR), MDRD Estimate 84 >60 mL/min/1.73sq m    CBC w/auto Differential (5 Part) (08/01/2016 12:12 PM) CBC w/auto Differential (5 Part) (08/01/2016 12:12 PM)  Component Value Ref Range  WBC (White Blood Cell Count) 8.8 4.1 - 10.2 10^3/uL  RBC (Red Blood Cell Count) 4.73 4.04 - 5.48 10^6/uL  Hemoglobin 15.2 (H) 12.0 - 15.0 gm/dL  Hematocrit 45.2 35.0 - 47.0 %  MCV (Mean Corpuscular Volume) 95.6 80.0 - 100.0 fl  MCH (Mean Corpuscular Hemoglobin) 32.1 (H) 27.0 - 31.2 pg  MCHC (Mean Corpuscular Hemoglobin Concentration) 33.6 32.0 - 36.0 gm/dL  Platelet Count 262 150 - 450 10^3/uL   Urinalysis Urinalysis w/Microscopic (08/19/2016 11:33 AM) Urinalysis w/Microscopic (08/19/2016 11:33 AM)  Component Value Ref Range  Color Yellow Yellow, Straw  Clarity SL Cloudy (A) Clear  Specific Gravity 1.025 1.000 - 1.030  pH, Urine 5.0 5.0 - 8.0  Protein, Urinalysis Negative Negative, Trace mg/dL  Glucose, Urinalysis Negative Negative mg/dL  Ketones, Urinalysis Trace (A) Negative mg/dL  Blood, Urinalysis Negative Negative  Nitrite, Urinalysis Negative Negative  Leukocyte Esterase, Urinalysis Small (A) Negative  White Blood Cells, Urinalysis 0-3 None Seen, 0-3 /hpf  Red Blood Cells, Urinalysis None Seen None Seen, 0-3 /hpf  Bacteria, Urinalysis Rare (A) None Seen /hpf  Squamous Epithelial Cells, Urinalysis Rare Rare, Few, None Seen /hpf    Pertinent Imaging: 06/30/2016 16:13  CLINICAL DATA:  Idiopathic acute pancreatitis, epigastric pain for 1 week  EXAM: CT ABDOMEN WITHOUT CONTRAST  TECHNIQUE: Multidetector CT imaging of the abdomen was performed following the standard protocol without IV contrast.  COMPARISON:  CT abdomen and pelvis of 05/10/2011  FINDINGS: The lung bases are clear. There is a dense calcification in the left lobe of liver which is not seen  previously and may indicate interval granulomatous disease. No calcified gallstones are seen. On this unenhanced study the pancreas is unremarkable. The peripancreatic fat planes appear well preserved in the pancreatic duct is not dilated. The right adrenal gland is unchanged. There is a left adrenal low-attenuation nodule measuring 20 mm in diameter which appears stable, consistent with incidental left adrenal adenoma. The spleen is normal in size. The stomach is decompressed. The kidneys are noted to be bilaterally prominent, but no hydronephrosis is seen. This appearance does not appear to have changed compared to the  prior CT with IV contrast in 2012. Bilaterally prominent kidneys can be seen with diabetes. Moderate abdominal aortic atherosclerosis is present. No adenopathy is noted. The lumbar vertebrae are in normal alignment with degenerative disc disease at L5-S1.  IMPRESSION: 1. The pancreas is unremarkable with no evidence of pancreatitis. The pancreatic duct is not dilated. 2. Both kidneys appear to be prominent although no hydronephrosis or mass is seen and this appearance is stable compared to the prior CT. Bilaterally enlarged kidneys can be seen with diabetes. 3. Moderate abdominal aortic atherosclerosis. 4. Stable left adrenal incidental adenoma.   Electronically Signed   By: Ivar Drape M.D.  CT abdomen and pelvis reviewed personally today.  Assessment & Plan:   65 year old female with history of diabetes and stroke along with history of stress incontinence status post bladder neck surgery 20 years ago presenting with long-standing mixed urinary incontinence, urge greater than stress. Postvoid residual today minimal, without concern for overflow incontinence.  1. Urinary frequency Behavioral modifications discussed today in detail- time and and double voiding recommended along with avoidance of irritating beverages Will try anticholinergic as this is been useful  in the past, we'll start with oxybutynin 10 mg XL for cost reasons Side effects of anticholinergics were discussed in detail including dry eyes, dry mouth, constipation, amongst others. Return to care in 4 weeks to reassess with bladder scan  2. Urge incontinence  As above - Bladder Scan (Post Void Residual) in office - oxybutynin (DITROPAN XL) 10 MG 24 hr tablet; Take 1 tablet (10 mg total) by mouth at bedtime.  Dispense: 30 tablet; Refill: 3  3. SUI (stress urinary incontinence, female) Long-standing, recommend Kegel exercises Release today signed for old [facility] records to assess what sort of bladder neck surgery was performed, suspect Wendee Copp procedure based on history  Return in about 4 weeks (around 10/07/2016) for PVR, symptoms recheck.  Hollice Espy, MD  [facility] [address], [place], [place] [address] [phone]

## 2016-09-12 MED ORDER — OXYBUTYNIN CHLORIDE ER 10 MG PO TB24: 10.0000 mg | ORAL_TABLET | Freq: Every day | ORAL | 3 refills | Status: AC

## 2016-09-16 ENCOUNTER — Ambulatory Visit
Admission: RE | Admit: 2016-09-16 | Discharge: 2016-09-16 | Disposition: A | Discharge status: Home / Self Care | Payer: Medicare Other | Source: Ambulatory Visit | Attending: Physical Medicine and Rehabilitation | Admitting: Physical Medicine and Rehabilitation

## 2016-09-16 DIAGNOSIS — G9619 Other disorders of meninges, not elsewhere classified: Secondary | ICD-10-CM | POA: Insufficient documentation

## 2016-09-16 DIAGNOSIS — M5136 Other intervertebral disc degeneration, lumbar region: Secondary | ICD-10-CM | POA: Diagnosis present

## 2016-09-16 DIAGNOSIS — M5416 Radiculopathy, lumbar region: Secondary | ICD-10-CM | POA: Diagnosis not present

## 2016-09-16 DIAGNOSIS — M5126 Other intervertebral disc displacement, lumbar region: Secondary | ICD-10-CM | POA: Diagnosis not present

## 2016-09-16 DIAGNOSIS — Z9889 Other specified postprocedural states: Secondary | ICD-10-CM | POA: Insufficient documentation

## 2016-09-16 DIAGNOSIS — M2578 Osteophyte, vertebrae: Secondary | ICD-10-CM | POA: Insufficient documentation

## 2016-10-07 ENCOUNTER — Ambulatory Visit: Payer: Medicare Other | Admitting: Urology

## 2017-01-04 ENCOUNTER — Ambulatory Visit: Payer: Medicare Other | Admitting: Pain Medicine

## 2017-01-14 ENCOUNTER — Ambulatory Visit
Admission: EM | Admit: 2017-01-14 | Discharge: 2017-01-14 | Disposition: A | Discharge status: Home / Self Care | Payer: Medicare Other | Attending: Emergency Medicine | Admitting: Emergency Medicine

## 2017-01-14 DIAGNOSIS — N390 Urinary tract infection, site not specified: Secondary | ICD-10-CM | POA: Diagnosis not present

## 2017-01-14 DIAGNOSIS — F319 Bipolar disorder, unspecified: Secondary | ICD-10-CM | POA: Diagnosis not present

## 2017-01-14 DIAGNOSIS — M810 Age-related osteoporosis without current pathological fracture: Secondary | ICD-10-CM | POA: Diagnosis not present

## 2017-01-14 DIAGNOSIS — M5030 Other cervical disc degeneration, unspecified cervical region: Secondary | ICD-10-CM | POA: Insufficient documentation

## 2017-01-14 DIAGNOSIS — K859 Acute pancreatitis without necrosis or infection, unspecified: Secondary | ICD-10-CM | POA: Diagnosis not present

## 2017-01-14 DIAGNOSIS — K219 Gastro-esophageal reflux disease without esophagitis: Secondary | ICD-10-CM | POA: Diagnosis not present

## 2017-01-14 DIAGNOSIS — E7800 Pure hypercholesterolemia, unspecified: Secondary | ICD-10-CM | POA: Diagnosis not present

## 2017-01-14 DIAGNOSIS — F17210 Nicotine dependence, cigarettes, uncomplicated: Secondary | ICD-10-CM | POA: Diagnosis not present

## 2017-01-14 DIAGNOSIS — Z794 Long term (current) use of insulin: Secondary | ICD-10-CM | POA: Diagnosis not present

## 2017-01-14 DIAGNOSIS — M545 Low back pain: Secondary | ICD-10-CM | POA: Diagnosis not present

## 2017-01-14 DIAGNOSIS — Z7982 Long term (current) use of aspirin: Secondary | ICD-10-CM | POA: Diagnosis not present

## 2017-01-14 DIAGNOSIS — E119 Type 2 diabetes mellitus without complications: Secondary | ICD-10-CM | POA: Insufficient documentation

## 2017-01-14 DIAGNOSIS — Z87440 Personal history of urinary (tract) infections: Secondary | ICD-10-CM | POA: Diagnosis not present

## 2017-01-14 DIAGNOSIS — Z7951 Long term (current) use of inhaled steroids: Secondary | ICD-10-CM | POA: Insufficient documentation

## 2017-01-14 DIAGNOSIS — Z8673 Personal history of transient ischemic attack (TIA), and cerebral infarction without residual deficits: Secondary | ICD-10-CM | POA: Insufficient documentation

## 2017-01-14 DIAGNOSIS — R350 Frequency of micturition: Secondary | ICD-10-CM | POA: Diagnosis present

## 2017-01-14 DIAGNOSIS — R109 Unspecified abdominal pain: Secondary | ICD-10-CM | POA: Diagnosis present

## 2017-01-14 DIAGNOSIS — G709 Myoneural disorder, unspecified: Secondary | ICD-10-CM | POA: Insufficient documentation

## 2017-01-14 DIAGNOSIS — R062 Wheezing: Secondary | ICD-10-CM | POA: Diagnosis not present

## 2017-01-14 LAB — URINALYSIS, COMPLETE (UACMP) WITH MICROSCOPIC
Bilirubin Urine: NEGATIVE
Glucose, UA: 100 mg/dL — AB
Nitrite: NEGATIVE
Protein, ur: NEGATIVE mg/dL
pH: 5.5 (ref 5.0–8.0)

## 2017-01-14 MED ORDER — PHENAZOPYRIDINE HCL 200 MG PO TABS
200.0000 mg | ORAL_TABLET | Freq: Three times a day (TID) | ORAL | 0 refills | Status: AC
Start: 2017-01-14 — End: ?

## 2017-01-14 MED ORDER — NITROFURANTOIN MONOHYD MACRO 100 MG PO CAPS: 100.0000 mg | ORAL_CAPSULE | Freq: Two times a day (BID) | ORAL | 0 refills | Status: AC

## 2017-01-14 NOTE — ED Provider Notes (Signed)
CSN: [id number]     Arrival date & time 01/14/17  1143 History   First MD Initiated Contact with Patient 01/14/17 1302     Chief Complaint  Patient presents with  . Urinary Frequency   (Consider location/radiation/quality/duration/timing/severity/associated sxs/prior Treatment) HPI  This 66 year old female who presents with 2 days of frequency dysuria and left flank pain as well as urgency and in satiety. Denies any fever or chills. He has low back pain as a normal consequence ; this is chronic. Denies any vaginal discharge. His had UTIs in the past and this is very similar. She has been drinking cranberry juice and trying to maintain fluid intake.       Past Medical History:  Diagnosis Date  . Barrett esophagus   . Bipolar disorder ([place])   . DDD (degenerative disc disease), cervical   . Degenerative disc disease, lumbar   . Diabetes mellitus without complication ([place])   . GERD (gastroesophageal reflux disease)   . Hypercholesteremia   . Neuromuscular disorder ([place])   . Osteoporosis   . Pancreatitis, acute   . Proteinuria   . Stroke [facility])    Past Surgical History:  Procedure Laterality Date  . BLADDER EXSTROPHY CLOSURE    . CESAREAN SECTION    . COLONOSCOPY    . COLONOSCOPY WITH PROPOFOL N/A 07/17/2015   Procedure: COLONOSCOPY WITH PROPOFOL;  Surgeon: Lollie Sails, MD;  Location: [facility] ENDOSCOPY;  Service: Endoscopy;  Laterality: N/A;  . ESOPHAGOGASTRODUODENOSCOPY (EGD) WITH PROPOFOL N/A 07/17/2015   Procedure: ESOPHAGOGASTRODUODENOSCOPY (EGD) WITH PROPOFOL;  Surgeon: Lollie Sails, MD;  Location: [facility] ENDOSCOPY;  Service: Endoscopy;  Laterality: N/A;  . microdisectomy tubular w/root decomp    . MOHS SURGERY    . removal left axillary mass    . TUBAL LIGATION     Family History  Problem Relation Age of Onset  . Bladder Cancer Neg Hx   . Kidney cancer Neg Hx    Social History  Substance Use Topics  . Smoking status: Current Every Day Smoker    Packs/day: 0.50     Types: Cigarettes  . Smokeless tobacco: Never Used  . Alcohol use No   OB History    No data available     Review of Systems  Constitutional: Positive for activity change and appetite change. Negative for chills, fatigue and fever.  Genitourinary: Positive for decreased urine volume, dysuria, flank pain, frequency and urgency. Negative for vaginal discharge.  All other systems reviewed and are negative.   Allergies  Patient has no known allergies.  Home Medications   Prior to Admission medications   Medication Sig Start Date End Date Taking? Authorizing Provider  albuterol (PROVENTIL HFA;VENTOLIN HFA) 108 (90 Base) MCG/ACT inhaler Inhale 2 puffs into the lungs every 4 (four) hours as needed for wheezing or shortness of breath. 03/08/16   Frederich Cha, MD  aspirin EC 81 MG tablet Take 81 mg by mouth daily.    Historical Provider, MD  clonazePAM (KLONOPIN) 1 MG tablet Take 1 mg by mouth 2 (two) times daily.    Historical Provider, MD  esomeprazole (NEXIUM) 40 MG capsule Take 40 mg by mouth daily at 12 noon.    Historical Provider, MD  gabapentin (NEURONTIN) 100 MG capsule Take 100 mg by mouth 2 (two) times daily.    Historical Provider, MD  glimepiride (AMARYL) 2 MG tablet Take 2 mg by mouth 2 (two) times daily.    Historical Provider, MD  insulin glargine (LANTUS) 100 UNIT/ML injection  Inject 16 Units into the skin at bedtime.    Historical Provider, MD  losartan (COZAAR) 50 MG tablet Take 50 mg by mouth daily.    Historical Provider, MD  meloxicam (MOBIC) 15 MG tablet Take 1 tablet (15 mg total) by mouth daily. 11/12/15   Frederich Cha, MD  metFORMIN (GLUCOPHAGE) 500 MG tablet Take 1,000 mg by mouth 2 (two) times daily with a meal.    Historical Provider, MD  nitrofurantoin, macrocrystal-monohydrate, (MACROBID) 100 MG capsule Take 1 capsule (100 mg total) by mouth 2 (two) times daily. 01/14/17   Lorin Picket, PA-C  nitroGLYCERIN (NITROSTAT) 0.4 MG SL tablet Place 0.4 mg under the  tongue every 5 (five) minutes as needed for chest pain.    Historical Provider, MD  oxybutynin (DITROPAN XL) 10 MG 24 hr tablet Take 1 tablet (10 mg total) by mouth at bedtime. 09/12/16   Hollice Espy, MD  phenazopyridine (PYRIDIUM) 200 MG tablet Take 1 tablet (200 mg total) by mouth 3 (three) times daily. 01/14/17   Lorin Picket, PA-C  ranitidine (ZANTAC) 150 MG capsule Take 150 mg by mouth 2 (two) times daily.    Historical Provider, MD  senna (SENOKOT) 8.6 MG tablet Take 1 tablet by mouth daily.    Historical Provider, MD  simvastatin (ZOCOR) 40 MG tablet Take 40 mg by mouth daily.    Historical Provider, MD  tiZANidine (ZANAFLEX) 4 MG tablet Take 1 tablet (4 mg total) by mouth every 6 (six) hours as needed for muscle spasms. 11/12/15   Frederich Cha, MD  traMADol (ULTRAM) 50 MG tablet Take 1 tablet (50 mg total) by mouth every 6 (six) hours as needed. 11/12/15   Frederich Cha, MD  traZODone (DESYREL) 50 MG tablet Take 50 mg by mouth at bedtime.    Historical Provider, MD   Meds Ordered and Administered this Visit  Medications - No data to display  BP 114/64 (BP Location: Left Arm)   Pulse 76   Temp 97.4 F (36.3 C)   Resp 18   Ht 5\' 3"  (1.6 m)   Wt 114 lb (51.7 kg)   SpO2 100%   BMI 20.19 kg/m  No data found.   Physical Exam  Constitutional: She is oriented to person, place, and time. She appears well-developed and well-nourished. No distress.  HENT:  Head: Normocephalic and atraumatic.  Eyes: Pupils are equal, round, and reactive to light. Right eye exhibits no discharge. Left eye exhibits no discharge.  Neck: Normal range of motion. Neck supple.  Pulmonary/Chest: Effort normal and breath sounds normal. No respiratory distress. She has no wheezes. She has no rales.  Abdominal: Soft. Bowel sounds are normal. She exhibits no distension. There is tenderness. There is no rebound and no guarding.  The patient states that her abdomen is always tender.  Musculoskeletal: Normal range  of motion.  Neurological: She is alert and oriented to person, place, and time.  Skin: Skin is warm and dry. She is not diaphoretic.  Psychiatric: She has a normal mood and affect. Her behavior is normal. Judgment and thought content normal.  Nursing note and vitals reviewed.   Urgent Care Course     Procedures (including critical care time)  Labs Review Labs Reviewed  URINALYSIS, COMPLETE (UACMP) WITH MICROSCOPIC - Abnormal; Notable for the following:       Result Value   APPearance CLOUDY (*)    Specific Gravity, Urine >1.030 (*)    Glucose, UA 100 (*)    Hgb  urine dipstick TRACE (*)    Ketones, ur TRACE (*)    Leukocytes, UA MODERATE (*)    Squamous Epithelial / LPF 6-30 (*)    Bacteria, UA MANY (*)    All other components within normal limits  URINE CULTURE    Imaging Review No results found.   Visual Acuity Review  Right Eye Distance:   Left Eye Distance:   Bilateral Distance:    Right Eye Near:   Left Eye Near:    Bilateral Near:         MDM   1. Lower urinary tract infectious disease    Discharge Medication List as of 01/14/2017  1:43 PM    START taking these medications   Details  nitrofurantoin, macrocrystal-monohydrate, (MACROBID) 100 MG capsule Take 1 capsule (100 mg total) by mouth 2 (two) times daily., Starting Sat 01/14/2017, Normal    phenazopyridine (PYRIDIUM) 200 MG tablet Take 1 tablet (200 mg total) by mouth 3 (three) times daily., Starting Sat 01/14/2017, Normal      Plan: 1. Test/x-ray results and diagnosis reviewed with patient 2. rx as per orders; risks, benefits, potential side effects reviewed with patient 3. Recommend supportive treatment with Fluids and rest. Cultures and sensitivities will be available in 2 days. Follow-up with her primary care if you are not improving. If you  begin to run high fevers have back pain go to the emergency room 4. F/u prn if symptoms worsen or don't improve     Lorin Picket, PA-C 01/14/17  1352

## 2017-01-14 NOTE — ED Triage Notes (Signed)
2 days of frequency, dysuria, and left flank pain. Pain 8/10

## 2017-01-16 LAB — URINE CULTURE: Culture: NO GROWTH

## 2017-03-08 ENCOUNTER — Other Ambulatory Visit: Payer: Self-pay | Admitting: Gastroenterology

## 2017-03-08 DIAGNOSIS — K859 Acute pancreatitis without necrosis or infection, unspecified: Secondary | ICD-10-CM

## 2017-03-08 DIAGNOSIS — K862 Cyst of pancreas: Secondary | ICD-10-CM

## 2017-03-29 ENCOUNTER — Other Ambulatory Visit: Payer: Self-pay | Admitting: Gastroenterology

## 2017-03-29 ENCOUNTER — Ambulatory Visit
Admission: RE | Admit: 2017-03-29 | Discharge: 2017-03-29 | Disposition: A | Discharge status: Home / Self Care | Payer: Medicare Other | Source: Ambulatory Visit | Attending: Gastroenterology | Admitting: Gastroenterology

## 2017-03-29 DIAGNOSIS — K861 Other chronic pancreatitis: Secondary | ICD-10-CM | POA: Diagnosis not present

## 2017-03-29 DIAGNOSIS — K859 Acute pancreatitis without necrosis or infection, unspecified: Secondary | ICD-10-CM

## 2017-03-29 DIAGNOSIS — D3502 Benign neoplasm of left adrenal gland: Secondary | ICD-10-CM | POA: Insufficient documentation

## 2017-03-29 DIAGNOSIS — I700 Atherosclerosis of aorta: Secondary | ICD-10-CM | POA: Diagnosis not present

## 2017-03-29 DIAGNOSIS — K862 Cyst of pancreas: Secondary | ICD-10-CM | POA: Diagnosis not present

## 2017-03-29 MED ORDER — GADOBENATE DIMEGLUMINE 529 MG/ML IV SOLN
10.0000 mL | Freq: Once | INTRAVENOUS | Status: AC | PRN
  Administered 2017-03-29: 10 mL via INTRAVENOUS
